# Patient Record
Sex: Female | Born: 1965 | Race: Black or African American | Hispanic: No | Marital: Single | Smoking: Never smoker
Health system: Southern US, Community
[De-identification: ages and names within clinical notes are randomized; demographics above are authoritative.]

## PROBLEM LIST (undated history)

## (undated) ENCOUNTER — Ambulatory Visit: Admission: EM | Payer: Medicare HMO

## (undated) DIAGNOSIS — J42 Unspecified chronic bronchitis: Secondary | ICD-10-CM

## (undated) DIAGNOSIS — D649 Anemia, unspecified: Secondary | ICD-10-CM

## (undated) DIAGNOSIS — F32A Depression, unspecified: Secondary | ICD-10-CM

## (undated) DIAGNOSIS — K219 Gastro-esophageal reflux disease without esophagitis: Secondary | ICD-10-CM

## (undated) DIAGNOSIS — F419 Anxiety disorder, unspecified: Secondary | ICD-10-CM

## (undated) DIAGNOSIS — Z8619 Personal history of other infectious and parasitic diseases: Secondary | ICD-10-CM

## (undated) HISTORY — PX: NO PAST SURGERIES: SHX2092

---

## 2011-01-30 DIAGNOSIS — D509 Iron deficiency anemia, unspecified: Secondary | ICD-10-CM | POA: Insufficient documentation

## 2011-01-30 DIAGNOSIS — K219 Gastro-esophageal reflux disease without esophagitis: Secondary | ICD-10-CM | POA: Insufficient documentation

## 2011-09-29 DIAGNOSIS — M21969 Unspecified acquired deformity of unspecified lower leg: Secondary | ICD-10-CM | POA: Insufficient documentation

## 2011-11-09 DIAGNOSIS — M2010 Hallux valgus (acquired), unspecified foot: Secondary | ICD-10-CM | POA: Insufficient documentation

## 2011-12-25 DIAGNOSIS — J45909 Unspecified asthma, uncomplicated: Secondary | ICD-10-CM | POA: Insufficient documentation

## 2012-03-28 DIAGNOSIS — F32A Depression, unspecified: Secondary | ICD-10-CM | POA: Insufficient documentation

## 2012-04-10 DIAGNOSIS — H9190 Unspecified hearing loss, unspecified ear: Secondary | ICD-10-CM | POA: Insufficient documentation

## 2012-10-04 DIAGNOSIS — D219 Benign neoplasm of connective and other soft tissue, unspecified: Secondary | ICD-10-CM | POA: Insufficient documentation

## 2013-03-04 DIAGNOSIS — H8110 Benign paroxysmal vertigo, unspecified ear: Secondary | ICD-10-CM | POA: Insufficient documentation

## 2013-10-28 DIAGNOSIS — E669 Obesity, unspecified: Secondary | ICD-10-CM | POA: Insufficient documentation

## 2016-04-24 DIAGNOSIS — R7303 Prediabetes: Secondary | ICD-10-CM | POA: Insufficient documentation

## 2016-10-18 DIAGNOSIS — E881 Lipodystrophy, not elsewhere classified: Secondary | ICD-10-CM | POA: Insufficient documentation

## 2020-01-26 ENCOUNTER — Encounter: Payer: Self-pay | Admitting: Emergency Medicine

## 2020-01-26 ENCOUNTER — Other Ambulatory Visit: Payer: Self-pay

## 2020-01-26 ENCOUNTER — Ambulatory Visit
Admission: EM | Admit: 2020-01-26 | Discharge: 2020-01-26 | Disposition: A | Discharge status: Home / Self Care | Payer: Medicare Other | Attending: Emergency Medicine | Admitting: Emergency Medicine

## 2020-01-26 DIAGNOSIS — B349 Viral infection, unspecified: Secondary | ICD-10-CM

## 2020-01-26 DIAGNOSIS — U071 COVID-19: Secondary | ICD-10-CM

## 2020-01-26 DIAGNOSIS — Z20822 Contact with and (suspected) exposure to covid-19: Secondary | ICD-10-CM

## 2020-01-26 HISTORY — DX: Gastro-esophageal reflux disease without esophagitis: K21.9

## 2020-01-26 HISTORY — DX: Anemia, unspecified: D64.9

## 2020-01-26 HISTORY — DX: Anxiety disorder, unspecified: F41.9

## 2020-01-26 HISTORY — DX: Unspecified chronic bronchitis: J42

## 2020-01-26 HISTORY — DX: COVID-19: U07.1

## 2020-01-26 HISTORY — DX: Personal history of other infectious and parasitic diseases: Z86.19

## 2020-01-26 HISTORY — DX: Depression, unspecified: F32.A

## 2020-01-26 NOTE — ED Provider Notes (Signed)
[facility] ____________________________________________  Time seen: Approximately 9:38 AM  I have reviewed the triage vital signs and the nursing notes.   HISTORY  Chief Complaint Fatigue, Generalized Body Aches, Headache, and Covid Exposure   HPI Angela Stevens is a 54 y.o. female presenting for evaluation of 1 day of runny nose, nasal congestion, cough and body aches.  States cough is minor, more nasal congestion.  Did take some over-the-counter ibuprofen which helped.  Denies known fevers.  Slightly diminished taste, no changes in smell.  Has not had COVID-19 vaccines.  Denies chest pain or shortness of breath or sore throat.  Continues eat and drink well.  Denies other aggravating alleviating factors.  Reports on 7 1 she was last around a friend who then subsequently tested positive for COVID-19 this past weekend.  Reports otherwise doing well.  Denies recent sickness.   Past Medical History:  Diagnosis Date  . Anemia   . Anxiety   . Chronic bronchitis ([place])   . Depression   . GERD (gastroesophageal reflux disease)   . History of Helicobacter pylori infection     There are no problems to display for this patient.   Past Surgical History:  Procedure Laterality Date  . NO PAST SURGERIES       No current facility-administered medications for this encounter.  Current Outpatient Medications:  .  albuterol (VENTOLIN HFA) 108 (90 Base) MCG/ACT inhaler, 2 puff(s) by inhalation every 4-6 hours prn, Disp: , Rfl:  .  buPROPion (WELLBUTRIN XL) 300 MG 24 hr tablet, Take 1 tablet by mouth daily., Disp: , Rfl:  .  clonazePAM (KLONOPIN) 0.5 MG tablet, Take 0.5 mg by mouth daily as needed., Disp: , Rfl:  .  famotidine (PEPCID) 40 MG tablet, Take 40 mg by mouth 2 (two) times daily., Disp: , Rfl:  .  fexofenadine (ALLEGRA) 180 MG tablet, Take 1 tablet by mouth daily as needed., Disp: , Rfl:  .  fluticasone (FLONASE) 50 MCG/ACT nasal spray, Place 2 sprays into the nose daily  as needed., Disp: , Rfl:  .  fluticasone (FLOVENT HFA) 110 MCG/ACT inhaler, Inhale 1 puff into the lungs 2 (two) times daily., Disp: , Rfl:  .  meloxicam (MOBIC) 15 MG tablet, Take 1 tablet by mouth daily as needed., Disp: , Rfl:  .  omeprazole (PRILOSEC) 20 MG capsule, Take 1 capsule by mouth daily., Disp: , Rfl:  .  polyethylene glycol powder (GLYCOLAX/MIRALAX) 17 GM/SCOOP powder, daily., Disp: , Rfl:  .  sucralfate (CARAFATE) 1 g tablet, Take 1 tablet by mouth at bedtime., Disp: , Rfl:  .  triamcinolone ointment (KENALOG) 0.1 %, Apply topically 2 (two) times daily., Disp: , Rfl:   Allergies Patient has no known allergies.  Family History  Problem Relation Age of Onset  . Alzheimer's disease Mother   . Diabetes Mother   . Kidney failure Father     Social History Social History   Tobacco Use  . Smoking status: Never Smoker  . Smokeless tobacco: Never Used  Vaping Use  . Vaping Use: Never used  Substance Use Topics  . Alcohol use: Never  . Drug use: Never    Review of Systems Constitutional: No fever ENT: No sore throat.  Positive congestion. Cardiovascular: Denies chest pain. Respiratory: Denies shortness of breath. Gastrointestinal: No abdominal pain.  No nausea, no vomiting.  No diarrhea. Musculoskeletal: Negative for back pain. Skin: Negative for rash.   ____________________________________________   PHYSICAL EXAM:  VITAL SIGNS: ED Triage Vitals  Enc Vitals Group     BP 01/26/20 0908 116/79     Pulse Rate 01/26/20 0908 75     Resp 01/26/20 0908 18     Temp 01/26/20 0908 98.4 F (36.9 C)     Temp Source 01/26/20 0908 Oral     SpO2 01/26/20 0908 99 %     Weight 01/26/20 0909 199 lb (90.3 kg)     Height 01/26/20 0909 5\' 4"  (1.626 m)     Head Circumference --      Peak Flow --      Pain Score 01/26/20 0908 8     Pain Loc --      Pain Edu? --      Excl. in [place]? --     Constitutional: Alert and oriented. Well appearing and in no acute distress. Eyes:  Conjunctivae are normal.  Head: Atraumatic. No sinus tenderness to palpation. No swelling. No erythema.  Ears: no erythema, normal TMs bilaterally.   Nose:Nasal congestion  Mouth/Throat: Mucous membranes are moist. No pharyngeal erythema. No tonsillar swelling or exudate.  Neck: No stridor.  No cervical spine tenderness to palpation. Hematological/Lymphatic/Immunilogical: No cervical lymphadenopathy. Cardiovascular: Normal rate, regular rhythm. Grossly normal heart sounds.  Good peripheral circulation. Respiratory: Normal respiratory effort.  No retractions. No wheezes, rales or rhonchi. Good air movement.  Musculoskeletal: Ambulatory with steady gait.  Neurologic:  Normal speech and language. No gait instability. Skin:  Skin appears warm, dry and intact. No rash noted. Psychiatric: Mood and affect are normal. Speech and behavior are normal.  ___________________________________________   LABS (all labs ordered are listed, but only abnormal results are displayed)  Labs Reviewed  SARS CORONAVIRUS 2 (TAT 6-24 HRS)   ____________________________________________  PROCEDURES Procedures    INITIAL IMPRESSION / ASSESSMENT AND PLAN / ED COURSE  Pertinent labs & imaging results that were available during my care of the patient were reviewed by me and considered in my medical decision making (see chart for details).  Overall well-appearing patient.  No acute distress.  Suspect viral illness, concern for COVID-19.  COVID-19 testing ordered.  Encourage rest, fluids, supportive care.  Work note given while awaiting test result.  Discussed follow up with Primary care physician this week as needed. Discussed follow up and return parameters including no resolution or any worsening concerns. Patient verbalized understanding and agreed to plan.   ____________________________________________   FINAL CLINICAL IMPRESSION(S) / ED DIAGNOSES  Final diagnoses:  Viral illness  Exposure to COVID-19  virus     ED Discharge Orders    None       Note: This dictation was prepared with Dragon dictation along with smaller phrase technology. Any transcriptional errors that result from this process are unintentional.         Marylene Land, NP 01/26/20 0945

## 2020-01-26 NOTE — Discharge Instructions (Signed)
Take over-the-counter cough congestion medication as needed.  Rest. Drink plenty of fluids.   Follow up with your primary care physician this week as needed. Return to Urgent care or ER for new or worsening concerns.

## 2020-01-26 NOTE — ED Triage Notes (Signed)
Patient in today c/o headache, fatigue and body aches x 1 day. Patient  had a +covid exposure on 01/22/20. Patient has not had the covid vaccines. Patient took OTC Ibuprofen this morning.

## 2020-01-27 LAB — SARS CORONAVIRUS 2 (TAT 6-24 HRS): SARS Coronavirus 2: POSITIVE — AB

## 2020-01-28 ENCOUNTER — Telehealth: Payer: Self-pay | Admitting: Physician Assistant

## 2020-01-28 NOTE — Telephone Encounter (Addendum)
Called to discuss with patient about Covid symptoms and the use of bamlanivimab/etesevimab or casirivimab/imdevimab, a monoclonal antibody infusion for those with mild to moderate Covid symptoms and at a high risk of hospitalization.  Pt is qualified for this infusion at the [facility] due to COPD   She lives in [place] and prefers to get it done in [place]. I have reached out to [place] with the [facility].   Angelena Form PA-C  [facility]

## 2020-02-05 DIAGNOSIS — J1282 Pneumonia due to coronavirus disease 2019: Secondary | ICD-10-CM | POA: Insufficient documentation

## 2020-02-16 ENCOUNTER — Encounter: Payer: Self-pay | Admitting: Emergency Medicine

## 2020-02-16 ENCOUNTER — Ambulatory Visit
Admission: EM | Admit: 2020-02-16 | Discharge: 2020-02-16 | Disposition: A | Discharge status: Home / Self Care | Payer: Medicare Other | Attending: Family Medicine | Admitting: Family Medicine

## 2020-02-16 ENCOUNTER — Other Ambulatory Visit: Payer: Self-pay

## 2020-02-16 DIAGNOSIS — H8110 Benign paroxysmal vertigo, unspecified ear: Secondary | ICD-10-CM | POA: Diagnosis not present

## 2020-02-16 MED ORDER — MECLIZINE HCL 25 MG PO TABS
25.0000 mg | ORAL_TABLET | Freq: Three times a day (TID) | ORAL | 0 refills | Status: DC | PRN
Start: 2020-02-16 — End: 2021-11-17

## 2020-02-16 NOTE — ED Provider Notes (Signed)
[facility]    CSN: [id number] Arrival date & time: 02/16/20  1308      History   Chief Complaint Chief Complaint  Patient presents with  . Dizziness    HPI Angela Stevens is a 54 y.o. female.   54 yo female with a c/o dizziness/vertigo since last week. States sensation is as if room was spinning. Symptom is worse with head movement or position changes. Denies any fevers, chills, numbness/tingling, unilateral weakness. States she had covid about 3 weeks ago.    Dizziness   Past Medical History:  Diagnosis Date  . Anemia   . Anxiety   . Chronic bronchitis ([place])   . COVID-19 01/26/2020  . Depression   . GERD (gastroesophageal reflux disease)   . History of Helicobacter pylori infection     There are no problems to display for this patient.   Past Surgical History:  Procedure Laterality Date  . NO PAST SURGERIES      OB History   No obstetric history on file.      Home Medications    Prior to Admission medications   Medication Sig Start Date End Date Taking? Authorizing Provider  albuterol (VENTOLIN HFA) 108 (90 Base) MCG/ACT inhaler 2 puff(s) by inhalation every 4-6 hours prn 10/24/18  Yes [provider]  buPROPion (WELLBUTRIN XL) 300 MG 24 hr tablet Take 1 tablet by mouth daily. 12/09/18  Yes [provider]  clonazePAM (KLONOPIN) 0.5 MG tablet Take 0.5 mg by mouth daily as needed. 01/05/20  Yes [provider]  famotidine (PEPCID) 40 MG tablet Take 40 mg by mouth 2 (two) times daily. 09/25/19  Yes [provider]  fexofenadine (ALLEGRA) 180 MG tablet Take 1 tablet by mouth daily as needed.   Yes [provider]  fluticasone (FLONASE) 50 MCG/ACT nasal spray Place 2 sprays into the nose daily as needed. 07/29/18  Yes [provider]  omeprazole (PRILOSEC) 20 MG capsule Take 1 capsule by mouth daily. 01/05/20  Yes [provider]  polyethylene glycol powder (GLYCOLAX/MIRALAX) 17 GM/SCOOP powder  daily.   Yes [provider]  sucralfate (CARAFATE) 1 g tablet Take 1 tablet by mouth at bedtime. 10/13/19  Yes [provider]  fluticasone (FLOVENT HFA) 110 MCG/ACT inhaler Inhale 1 puff into the lungs 2 (two) times daily. 03/24/15   [provider]  meclizine (ANTIVERT) 25 MG tablet Take 1 tablet (25 mg total) by mouth 3 (three) times daily as needed for dizziness. 02/16/20   Norval Gable, MD  meloxicam (MOBIC) 15 MG tablet Take 1 tablet by mouth daily as needed. 05/26/19   [provider]  triamcinolone ointment (KENALOG) 0.1 % Apply topically 2 (two) times daily. 12/29/19   [provider]    Family History Family History  Problem Relation Age of Onset  . Alzheimer's disease Mother   . Diabetes Mother   . Kidney failure Father     Social History Social History   Tobacco Use  . Smoking status: Never Smoker  . Smokeless tobacco: Never Used  Vaping Use  . Vaping Use: Never used  Substance Use Topics  . Alcohol use: Never  . Drug use: Never     Allergies   Patient has no known allergies.   Review of Systems Review of Systems  Neurological: Positive for dizziness.     Physical Exam Triage Vital Signs ED Triage Vitals  Enc Vitals Group     BP 02/16/20 1342 121/75  Pulse Rate 02/16/20 1342 89     Resp 02/16/20 1342 18     Temp 02/16/20 1342 98.4 F (36.9 C)     Temp Source 02/16/20 1342 Oral     SpO2 02/16/20 1342 99 %     Weight 02/16/20 1340 197 lb (89.4 kg)     Height 02/16/20 1340 5\' 4"  (1.626 m)     Head Circumference --      Peak Flow --      Pain Score 02/16/20 1339 0     Pain Loc --      Pain Edu? --      Excl. in [place]? --    No data found.  Updated Vital Signs BP 121/75 (BP Location: Right Arm)   Pulse 89   Temp 98.4 F (36.9 C) (Oral)   Resp 18   Ht 5\' 4"  (1.626 m)   Wt 89.4 kg   SpO2 99%   BMI 33.81 kg/m   Visual Acuity Right Eye Distance:   Left Eye Distance:   Bilateral Distance:     Right Eye Near:   Left Eye Near:    Bilateral Near:     Physical Exam Vitals and nursing note reviewed.  Constitutional:      General: She is not in acute distress.    Appearance: She is not toxic-appearing or diaphoretic.  HENT:     Right Ear: Tympanic membrane normal.     Left Ear: Tympanic membrane normal.  Eyes:     Extraocular Movements: Extraocular movements intact.     Pupils: Pupils are equal, round, and reactive to light.  Cardiovascular:     Rate and Rhythm: Normal rate and regular rhythm.  Pulmonary:     Effort: Pulmonary effort is normal. No respiratory distress.  Musculoskeletal:     Cervical back: Neck supple.  Neurological:     General: No focal deficit present.     Mental Status: She is alert and oriented to person, place, and time.     Cranial Nerves: No cranial nerve deficit.     Sensory: No sensory deficit.     Motor: No weakness.     Coordination: Coordination normal.     Gait: Gait normal.     Deep Tendon Reflexes: Reflexes normal.     Comments: Positive Hallpike maneuver       UC Treatments / Results  Labs (all labs ordered are listed, but only abnormal results are displayed) Labs Reviewed - No data to display  EKG   Radiology No results found.  Procedures Procedures (including critical care time)  Medications Ordered in UC Medications - No data to display  Initial Impression / Assessment and Plan / UC Course  I have reviewed the triage vital signs and the nursing notes.  Pertinent labs & imaging results that were available during my care of the patient were reviewed by me and considered in my medical decision making (see chart for details).     Final Clinical Impressions(s) / UC Diagnoses   Final diagnoses:  Benign paroxysmal positional vertigo, unspecified laterality    ED Prescriptions    Medication Sig Dispense Auth. Provider   meclizine (ANTIVERT) 25 MG tablet Take 1 tablet (25 mg total) by mouth 3 (three) times daily  as needed for dizziness. 30 tablet Norval Gable, MD      1. diagnosis reviewed with patient 2. rx as per orders above; reviewed possible side effects, interactions, risks and benefits  3. Recommend supportive treatment with  vestibular exercises 4. Follow-up prn if symptoms worsen or don't improve   PDMP not reviewed this encounter.   Norval Gable, MD 02/16/20 1520

## 2020-02-16 NOTE — ED Triage Notes (Signed)
Patient c/o vertigo that started last week when she was out sick with COVID. She tested positive for COVID on 01/26/2020. She states she has been out of quarantine since 02/05/2020. She does report having vertigo in the past.

## 2020-08-12 ENCOUNTER — Ambulatory Visit
Admission: EM | Admit: 2020-08-12 | Discharge: 2020-08-12 | Disposition: A | Discharge status: Home / Self Care | Payer: Medicare Other | Attending: Sports Medicine | Admitting: Sports Medicine

## 2020-08-12 ENCOUNTER — Other Ambulatory Visit: Payer: Self-pay

## 2020-08-12 DIAGNOSIS — J069 Acute upper respiratory infection, unspecified: Secondary | ICD-10-CM | POA: Diagnosis not present

## 2020-08-12 DIAGNOSIS — M791 Myalgia, unspecified site: Secondary | ICD-10-CM | POA: Diagnosis not present

## 2020-08-12 DIAGNOSIS — U071 COVID-19: Secondary | ICD-10-CM | POA: Insufficient documentation

## 2020-08-12 DIAGNOSIS — R519 Headache, unspecified: Secondary | ICD-10-CM

## 2020-08-12 DIAGNOSIS — Z20822 Contact with and (suspected) exposure to covid-19: Secondary | ICD-10-CM

## 2020-08-12 LAB — SARS CORONAVIRUS 2 (TAT 6-24 HRS): SARS Coronavirus 2: POSITIVE — AB

## 2020-08-12 NOTE — ED Provider Notes (Signed)
[facility]    CSN: [id number] Arrival date & time: 08/12/20  0827      History   Chief Complaint Chief Complaint  Patient presents with  . Cough    HPI Angela Stevens is a 55 y.o. female.   Pleasant 55 year old female who presents for evaluation of the above issues.  Patient reports cough, rhinorrhea, headache, myalgia, scratchy throat and sneezing for about 5 days.  She is concerned for COVID.  She has COVID exposure.  She has been vaccinated x2 but no booster.  She has received her flu shot.  No fever shakes chills, no nausea vomiting or diarrhea.  No chest pain or shortness of breath.  No abdominal or urinary symptoms.  She has had COVID in the past July 2021 and spent 3 days in the hospital.  No red flag signs or symptoms elicited on history.  She stays at home and does not work outside the home.     Past Medical History:  Diagnosis Date  . Anemia   . Anxiety   . Chronic bronchitis ([place])   . COVID-19 01/26/2020  . Depression   . GERD (gastroesophageal reflux disease)   . History of Helicobacter pylori infection     There are no problems to display for this patient.   Past Surgical History:  Procedure Laterality Date  . NO PAST SURGERIES      OB History   No obstetric history on file.      Home Medications    Prior to Admission medications   Medication Sig Start Date End Date Taking? Authorizing Provider  albuterol (VENTOLIN HFA) 108 (90 Base) MCG/ACT inhaler 2 puff(s) by inhalation every 4-6 hours prn 10/24/18  Yes [provider]  buPROPion (WELLBUTRIN XL) 300 MG 24 hr tablet Take 1 tablet by mouth daily. 12/09/18  Yes [provider]  clonazePAM (KLONOPIN) 0.5 MG tablet Take 0.5 mg by mouth daily as needed. 01/05/20  Yes [provider]  famotidine (PEPCID) 40 MG tablet Take 40 mg by mouth 2 (two) times daily. 09/25/19  Yes [provider]  fexofenadine (ALLEGRA) 180 MG tablet Take 1 tablet by mouth daily as  needed.   Yes [provider]  fluticasone (FLONASE) 50 MCG/ACT nasal spray Place 2 sprays into the nose daily as needed. 07/29/18  Yes [provider]  fluticasone (FLOVENT HFA) 110 MCG/ACT inhaler Inhale 1 puff into the lungs 2 (two) times daily. 03/24/15  Yes [provider]  meclizine (ANTIVERT) 25 MG tablet Take 1 tablet (25 mg total) by mouth 3 (three) times daily as needed for dizziness. 02/16/20  Yes Norval Gable, MD  meloxicam (MOBIC) 15 MG tablet Take 1 tablet by mouth daily as needed. 05/26/19  Yes [provider]  omeprazole (PRILOSEC) 20 MG capsule Take 1 capsule by mouth daily. 01/05/20  Yes [provider]  polyethylene glycol powder (GLYCOLAX/MIRALAX) 17 GM/SCOOP powder daily.   Yes [provider]  sucralfate (CARAFATE) 1 g tablet Take 1 tablet by mouth at bedtime. 10/13/19  Yes [provider]  triamcinolone ointment (KENALOG) 0.1 % Apply topically 2 (two) times daily. 12/29/19  Yes [provider]    Family History Family History  Problem Relation Age of Onset  . Alzheimer's disease Mother   . Diabetes Mother   . Kidney failure Father     Social History Social History   Tobacco Use  . Smoking status: Never Smoker  . Smokeless tobacco: Never Used  Vaping Use  .  Vaping Use: Never used  Substance Use Topics  . Alcohol use: Never  . Drug use: Never     Allergies   Patient has no known allergies.   Review of Systems Review of Systems  Constitutional: Negative for chills, fatigue and fever.  HENT: Positive for congestion, rhinorrhea, sneezing and sore throat. Negative for ear discharge, ear pain, sinus pressure and sinus pain.   Eyes: Negative for pain.  Respiratory: Positive for cough. Negative for apnea, chest tightness, shortness of breath, wheezing and stridor.   Cardiovascular: Negative for chest pain.  Gastrointestinal: Negative for abdominal pain.  Genitourinary: Negative for dysuria.   Musculoskeletal: Positive for myalgias.  Skin: Negative for color change, pallor, rash and wound.  Neurological: Positive for headaches.  All other systems reviewed and are negative.    Physical Exam Triage Vital Signs ED Triage Vitals  Enc Vitals Group     BP 08/12/20 0923 124/69     Pulse --      Resp 08/12/20 0923 18     Temp 08/12/20 0923 98.2 F (36.8 C)     Temp Source 08/12/20 0923 Oral     SpO2 08/12/20 0923 100 %     Weight 08/12/20 0921 214 lb (97.1 kg)     Height 08/12/20 0921 5\' 3"  (1.6 m)     Head Circumference --      Peak Flow --      Pain Score 08/12/20 0921 7     Pain Loc --      Pain Edu? --      Excl. in [place]? --    No data found.  Updated Vital Signs BP 124/69 (BP Location: Left Arm)   Temp 98.2 F (36.8 C) (Oral)   Resp 18   Ht 5\' 3"  (1.6 m)   Wt 97.1 kg   SpO2 100%   BMI 37.91 kg/m   Visual Acuity Right Eye Distance:   Left Eye Distance:   Bilateral Distance:    Right Eye Near:   Left Eye Near:    Bilateral Near:     Physical Exam Constitutional:      General: She is not in acute distress.    Appearance: Normal appearance. She is not ill-appearing or toxic-appearing.  HENT:     Head: Normocephalic and atraumatic.     Right Ear: Tympanic membrane normal.     Left Ear: Tympanic membrane normal.     Nose: No congestion or rhinorrhea.     Mouth/Throat:     Mouth: Mucous membranes are moist.     Pharynx: Posterior oropharyngeal erythema present. No oropharyngeal exudate.  Eyes:     Extraocular Movements: Extraocular movements intact.     Conjunctiva/sclera: Conjunctivae normal.     Pupils: Pupils are equal, round, and reactive to light.  Cardiovascular:     Rate and Rhythm: Normal rate and regular rhythm.     Pulses: Normal pulses.     Heart sounds: Normal heart sounds. No murmur heard. No friction rub. No gallop.   Pulmonary:     Effort: Pulmonary effort is normal. No respiratory distress.     Breath sounds: Normal breath  sounds. No stridor. No wheezing, rhonchi or rales.  Musculoskeletal:     Cervical back: Normal range of motion and neck supple. No rigidity or tenderness.  Lymphadenopathy:     Cervical: Cervical adenopathy present.  Skin:    General: Skin is warm and dry.     Capillary Refill: Capillary refill takes less  than 2 seconds.  Neurological:     General: No focal deficit present.     Mental Status: She is alert and oriented to person, place, and time.      UC Treatments / Results  Labs (all labs ordered are listed, but only abnormal results are displayed) Labs Reviewed  SARS CORONAVIRUS 2 (TAT 6-24 HRS)    EKG   Radiology No results found.  Procedures Procedures (including critical care time)  Medications Ordered in UC Medications - No data to display  Initial Impression / Assessment and Plan / UC Course  I have reviewed the triage vital signs and the nursing notes.  Pertinent labs & imaging results that were available during my care of the patient were reviewed by me and considered in my medical decision making (see chart for details).   Clinical impression: 5 days of flulike symptoms with COVID exposure.  She has been vaccinated and has had COVID back in July 2021.  Treatment plan: 1.  The findings and treatment plan were discussed in detail with the patient.  Patient was in agreement. 2.  Will get a COVID test.  Send it to the hospital for PCR.  I will take 6 to 24 hours.  She is not working outside the home so I have asked her just to isolate pending the results.  If she is positive someone will contact her and she will go into quarantine per CDC guidelines.  She does not need a work note. 3.  Educational handout was provided. 4.  Plenty of rest, plenty of fluids, Tylenol or Motrin for fever discomfort.  Over-the-counter meds as needed. 5.  Red flag signs and symptoms were discussed in detail and when to seek out immediate medical attention. 6.  Follow-up here as  needed.    Final Clinical Impressions(s) / UC Diagnoses   Final diagnoses:  Viral URI with cough  Myalgia  Acute nonintractable headache, unspecified headache type  Close exposure to COVID-19 virus     Discharge Instructions     Will get a COVID test.  Send it to the hospital for PCR.  I will take 6 to 24 hours.  She is not working outside the home so I have asked her just to isolate pending the results.  If she is positive someone will contact her and she will go into quarantine per CDC guidelines.  She does not need a work note. Educational handout was provided. Plenty of rest, plenty of fluids, Tylenol or Motrin for fever discomfort.  Over-the-counter meds as needed. Red flag signs and symptoms were discussed in detail and when to seek out immediate medical attention. Follow-up here as needed.    ED Prescriptions    None     PDMP not reviewed this encounter.   Verda Cumins, MD 08/12/20 1021

## 2020-08-12 NOTE — ED Triage Notes (Signed)
Patient states that she has been having a cough, headache, body aches, sinus pain and pressure x 5 days. States that she is concerned for covid.

## 2020-08-12 NOTE — Discharge Instructions (Addendum)
Will get a COVID test.  Send it to the hospital for PCR.  I will take 6 to 24 hours.  She is not working outside the home so I have asked her just to isolate pending the results.  If she is positive someone will contact her and she will go into quarantine per CDC guidelines.  She does not need a work note. Educational handout was provided. Plenty of rest, plenty of fluids, Tylenol or Motrin for fever discomfort.  Over-the-counter meds as needed. Red flag signs and symptoms were discussed in detail and when to seek out immediate medical attention. Follow-up here as needed.

## 2020-09-30 DIAGNOSIS — R0989 Other specified symptoms and signs involving the circulatory and respiratory systems: Secondary | ICD-10-CM | POA: Insufficient documentation

## 2021-01-06 ENCOUNTER — Other Ambulatory Visit: Payer: Self-pay

## 2021-01-06 ENCOUNTER — Ambulatory Visit
Admission: EM | Admit: 2021-01-06 | Discharge: 2021-01-06 | Disposition: A | Discharge status: Home / Self Care | Payer: Medicare Other

## 2021-01-06 DIAGNOSIS — H5712 Ocular pain, left eye: Secondary | ICD-10-CM

## 2021-01-06 DIAGNOSIS — H04129 Dry eye syndrome of unspecified lacrimal gland: Secondary | ICD-10-CM | POA: Diagnosis not present

## 2021-01-06 DIAGNOSIS — H538 Other visual disturbances: Secondary | ICD-10-CM | POA: Diagnosis not present

## 2021-01-06 NOTE — Discharge Instructions (Addendum)
A limited eye exam in urgent care is normal and it was also normal in ER, but I believe you need to see an eye specialist.  I have contacted [facility] and they are supposed to give you a call today to try to work you in today or tomorrow.  As we discussed, this could be related to an ocular migraine if your eye examines it being normal.  In which case, he should follow-up with your PCP.  [facility] Address: [address], [place], [place] [address] Hours: 8-5 Phone: [phone]

## 2021-01-06 NOTE — ED Triage Notes (Signed)
Pt sts her eyes feels like there is something in them. Sts there is sometimes a shooting pain. Seen at [facility] ED 2 days ago for the same. Sts she have been using eye drops without relief.

## 2021-01-06 NOTE — ED Provider Notes (Signed)
[facility]    CSN: [id number] Arrival date & time: 01/06/21  0805      History   Chief Complaint Chief Complaint  Patient presents with   Eye Problem    HPI Angela Stevens is a 55 y.o. female presenting for approximately 1 week history of left-sided eye pain that she rates about 3 out of 10.  She says it also affects the left frontal region causing her discomfort.  She says that she has had occasionally blurry vision.  She is that her eye feels very dry.  She has not had any drainage from the eye or redness.  She denies any sort of injury to the eye.  Patient admits to occasional feeling dizzy.  She says she has some photophobia present.  No nausea or vomiting.  Patient denies history of migraines or headaches.  She denies any history of eye problems but review of medical record suggest that she has had cataracts.  Patient went to the ED 2 days ago for this complaint and was advised she had a normal eye exam and to try over-the-counter Visine and follow-up with eye specialist.  Patient states she has tried Visine without relief.  She made appointment with an eye specialist but cannot be seen for 2 to 3 weeks and would like some relief sooner.  No other complaints.  HPI  Past Medical History:  Diagnosis Date   Anemia    Anxiety    Chronic bronchitis ([place])    COVID-19 01/26/2020   Depression    GERD (gastroesophageal reflux disease)    History of Helicobacter pylori infection     There are no problems to display for this patient.   Past Surgical History:  Procedure Laterality Date   NO PAST SURGERIES      OB History   No obstetric history on file.      Home Medications    Prior to Admission medications   Medication Sig Start Date End Date Taking? Authorizing Provider  albuterol (VENTOLIN HFA) 108 (90 Base) MCG/ACT inhaler 2 puff(s) by inhalation every 4-6 hours prn 10/24/18   [provider]  buPROPion (WELLBUTRIN XL) 300 MG 24 hr tablet Take 1  tablet by mouth daily. 12/09/18   [provider]  clonazePAM (KLONOPIN) 0.5 MG tablet Take 0.5 mg by mouth daily as needed. 01/05/20   [provider]  famotidine (PEPCID) 40 MG tablet Take 40 mg by mouth 2 (two) times daily. 09/25/19   [provider]  fexofenadine (ALLEGRA) 180 MG tablet Take 1 tablet by mouth daily as needed.    [provider]  fluticasone (FLONASE) 50 MCG/ACT nasal spray Place 2 sprays into the nose daily as needed. 07/29/18   [provider]  fluticasone (FLOVENT HFA) 110 MCG/ACT inhaler Inhale 1 puff into the lungs 2 (two) times daily. 03/24/15   [provider]  meclizine (ANTIVERT) 25 MG tablet Take 1 tablet (25 mg total) by mouth 3 (three) times daily as needed for dizziness. 02/16/20   Norval Gable, MD  meloxicam (MOBIC) 15 MG tablet Take 1 tablet by mouth daily as needed. 05/26/19   [provider]  omeprazole (PRILOSEC) 20 MG capsule Take 1 capsule by mouth daily. 01/05/20   [provider]  polyethylene glycol powder (GLYCOLAX/MIRALAX) 17 GM/SCOOP powder daily.    [provider]  sucralfate (CARAFATE) 1 g tablet Take 1 tablet by mouth at bedtime. 10/13/19   [provider]  triamcinolone ointment (KENALOG) 0.1 %  Apply topically 2 (two) times daily. 12/29/19   [provider]    Family History Family History  Problem Relation Age of Onset   Alzheimer's disease Mother    Diabetes Mother    Kidney failure Father     Social History Social History   Tobacco Use   Smoking status: Never   Smokeless tobacco: Never  Vaping Use   Vaping Use: Never used  Substance Use Topics   Alcohol use: Never   Drug use: Never     Allergies   Patient has no known allergies.   Review of Systems Review of Systems  Constitutional:  Negative for fatigue and fever.  HENT:  Negative for congestion and rhinorrhea.   Eyes:  Positive for photophobia, pain and visual disturbance. Negative  for discharge, redness and itching.  Respiratory:  Negative for cough.   Gastrointestinal:  Negative for nausea and vomiting.  Neurological:  Positive for headaches. Negative for dizziness, syncope, weakness and numbness.    Physical Exam Triage Vital Signs ED Triage Vitals [01/06/21 0819]  Enc Vitals Group     BP 116/69     Pulse Rate 69     Resp 18     Temp 98.1 F (36.7 C)     Temp Source Oral     SpO2 100 %     Weight 212 lb (96.2 kg)     Height 5\' 3"  (1.6 m)     Head Circumference      Peak Flow      Pain Score 8     Pain Loc      Pain Edu?      Excl. in [place]?    No data found.  Updated Vital Signs BP 116/69   Pulse 69   Temp 98.1 F (36.7 C) (Oral)   Resp 18   Ht 5\' 3"  (1.6 m)   Wt 212 lb (96.2 kg)   SpO2 100%   BMI 37.55 kg/m   Visual Acuity Right Eye Distance: 20/25 Left Eye Distance: 20/25 Bilateral Distance: 20/25  Right Eye Near:   Left Eye Near:    Bilateral Near:     Physical Exam Vitals and nursing note reviewed.  Constitutional:      General: She is not in acute distress.    Appearance: Normal appearance. She is not ill-appearing or toxic-appearing.  HENT:     Head: Normocephalic and atraumatic.     Nose: Nose normal.     Mouth/Throat:     Mouth: Mucous membranes are moist.     Pharynx: Oropharynx is clear.  Eyes:     General: No scleral icterus.       Right eye: No discharge.        Left eye: No discharge.     Conjunctiva/sclera: Conjunctivae normal.     Pupils: Pupils are equal, round, and reactive to light.  Cardiovascular:     Rate and Rhythm: Normal rate and regular rhythm.     Heart sounds: Normal heart sounds.  Pulmonary:     Effort: Pulmonary effort is normal. No respiratory distress.     Breath sounds: Normal breath sounds.  Musculoskeletal:     Cervical back: Neck supple.  Skin:    General: Skin is dry.  Neurological:     General: No focal deficit present.     Mental Status: She is alert and oriented to person,  place, and time. Mental status is at baseline.     Cranial Nerves: No cranial  nerve deficit.     Motor: No weakness.     Gait: Gait normal.  Psychiatric:        Mood and Affect: Mood normal.        Behavior: Behavior normal.        Thought Content: Thought content normal.     UC Treatments / Results  Labs (all labs ordered are listed, but only abnormal results are displayed) Labs Reviewed - No data to display  EKG   Radiology No results found.  Procedures Procedures (including critical care time)  Medications Ordered in UC Medications - No data to display  Initial Impression / Assessment and Plan / UC Course  I have reviewed the triage vital signs and the nursing notes.  Pertinent labs & imaging results that were available during my care of the patient were reviewed by me and considered in my medical decision making (see chart for details).  89 old female presenting for left eye pain, dryness, photophobia and occasionally blurred vision.  Also most left-sided headaches and occasional dizziness.  Limited eye exam today is within normal limits.  Vision is grossly intact and 20/25 left, right, and bilateral.  Cranial nerve exam is normal.  Advised patient her limited eye exam is normal but she should see an eye specialist who can perform a more comprehensive eye exam.  I have contacted [facility] in [facility] and they will give patient a call to try to get her in today or tomorrow for an appointment.  Advised her to go to the ED sooner for any acute worsening of her symptoms.  Advised Tylenol and Motrin at this time for the discomfort and trying to rest.  Suggested the patient that if her eye exam ends up being normal then it is possible she could be having an ocular migraine, in which case I would advise her following up with her PCP.  Again, ED precautions reviewed.   Final Clinical Impressions(s) / UC Diagnoses   Final diagnoses:  Left eye pain  Blurry  vision, left eye  Dry eye     Discharge Instructions      A limited eye exam in urgent care is normal and it was also normal in ER, but I believe you need to see an eye specialist.  I have contacted [facility] and they are supposed to give you a call today to try to work you in today or tomorrow.  As we discussed, this could be related to an ocular migraine if your eye examines it being normal.  In which case, he should follow-up with your PCP.  [facility] Address: [address], [place], [place] [address] Hours: 8-5 Phone: [phone]   ED Prescriptions   None    PDMP not reviewed this encounter.   Danton Clap, PA-C 01/06/21 [phone]

## 2021-04-02 ENCOUNTER — Other Ambulatory Visit: Payer: Self-pay

## 2021-04-02 ENCOUNTER — Encounter: Payer: Self-pay | Admitting: Emergency Medicine

## 2021-04-02 ENCOUNTER — Ambulatory Visit
Admission: EM | Admit: 2021-04-02 | Discharge: 2021-04-02 | Disposition: A | Discharge status: Home / Self Care | Payer: Medicare HMO | Attending: Family Medicine | Admitting: Family Medicine

## 2021-04-02 DIAGNOSIS — R300 Dysuria: Secondary | ICD-10-CM | POA: Insufficient documentation

## 2021-04-02 LAB — POCT URINALYSIS DIP (DEVICE)
Bilirubin Urine: NEGATIVE
Glucose, UA: NEGATIVE mg/dL
Ketones, ur: NEGATIVE mg/dL
Leukocytes,Ua: NEGATIVE
Nitrite: NEGATIVE
Protein, ur: NEGATIVE mg/dL
Specific Gravity, Urine: <=1.005 (ref 1.005–1.030)
Urobilinogen, UA: 0.2 mg/dL (ref 0.0–1.0)
pH: 5.5 (ref 5.0–8.0)

## 2021-04-02 MED ORDER — PHENAZOPYRIDINE HCL 200 MG PO TABS: 200.0000 mg | ORAL_TABLET | Freq: Three times a day (TID) | ORAL | 0 refills | Status: DC

## 2021-04-02 NOTE — ED Triage Notes (Signed)
Patient c/o burning when urinating and urinary urgency and frequency for the past 3-4 days.  Patient denies blood in her urine.

## 2021-04-02 NOTE — ED Provider Notes (Signed)
[facility]    CSN: [id number] Arrival date & time: 04/02/21  0818      History   Chief Complaint Chief Complaint  Patient presents with   Dysuria    HPI Leonila Emmi is a 55 y.o. female.   HPI  55 year old female here for evaluation of urinary complaints.  Patient reports that for the last 3 to 4 days she has been experiencing painful urination with urinary urgency and frequency.  She is also had some associated symptoms of low back pain, nocturia with 3-4 trips to the bathroom nightly for low-volume voids.  She has not seen any blood in her urine she denies fever.  She has had some suprapubic tenderness but no other abdominal pain, nausea, or vomiting.  She denies vaginal itching, irritation, or discharge.  Past Medical History:  Diagnosis Date   Anemia    Anxiety    Chronic bronchitis ([place])    COVID-19 01/26/2020   Depression    GERD (gastroesophageal reflux disease)    History of Helicobacter pylori infection     There are no problems to display for this patient.   Past Surgical History:  Procedure Laterality Date   NO PAST SURGERIES      OB History   No obstetric history on file.      Home Medications    Prior to Admission medications   Medication Sig Start Date End Date Taking? Authorizing Provider  albuterol (VENTOLIN HFA) 108 (90 Base) MCG/ACT inhaler 2 puff(s) by inhalation every 4-6 hours prn 10/24/18  Yes [provider]  buPROPion (WELLBUTRIN XL) 300 MG 24 hr tablet Take 1 tablet by mouth daily. 12/09/18  Yes [provider]  clonazePAM (KLONOPIN) 0.5 MG tablet Take 0.5 mg by mouth daily as needed. 01/05/20  Yes [provider]  famotidine (PEPCID) 40 MG tablet Take 40 mg by mouth 2 (two) times daily. 09/25/19  Yes [provider]  fexofenadine (ALLEGRA) 180 MG tablet Take 1 tablet by mouth daily as needed.   Yes [provider]  fluticasone (FLONASE) 50 MCG/ACT nasal spray Place 2 sprays into  the nose daily as needed. 07/29/18  Yes [provider]  fluticasone (FLOVENT HFA) 110 MCG/ACT inhaler Inhale 1 puff into the lungs 2 (two) times daily. 03/24/15  Yes [provider]  meloxicam (MOBIC) 15 MG tablet Take 1 tablet by mouth daily as needed. 05/26/19  Yes [provider]  omeprazole (PRILOSEC) 20 MG capsule Take 1 capsule by mouth daily. 01/05/20  Yes [provider]  phenazopyridine (PYRIDIUM) 200 MG tablet Take 1 tablet (200 mg total) by mouth 3 (three) times daily. 04/02/21  Yes Margarette Canada, NP  sucralfate (CARAFATE) 1 g tablet Take 1 tablet by mouth at bedtime. 10/13/19  Yes [provider]  meclizine (ANTIVERT) 25 MG tablet Take 1 tablet (25 mg total) by mouth 3 (three) times daily as needed for dizziness. 02/16/20   Norval Gable, MD  polyethylene glycol powder (GLYCOLAX/MIRALAX) 17 GM/SCOOP powder daily.    [provider]  triamcinolone ointment (KENALOG) 0.1 % Apply topically 2 (two) times daily. 12/29/19   [provider]    Family History Family History  Problem Relation Age of Onset   Alzheimer's disease Mother    Diabetes Mother    Kidney failure Father     Social History Social History   Tobacco Use   Smoking status: Never   Smokeless tobacco: Never  Vaping Use   Vaping Use: Never used  Substance Use Topics   Alcohol use: Never   Drug use: Never     Allergies   Patient has no known allergies.   Review of Systems Review of Systems  Constitutional:  Negative for activity change, appetite change and fever.  Gastrointestinal:  Positive for abdominal pain. Negative for diarrhea, nausea and vomiting.  Genitourinary:  Positive for dysuria, frequency and urgency. Negative for hematuria, vaginal bleeding, vaginal discharge and vaginal pain.  Musculoskeletal:  Positive for back pain.  Skin:  Negative for rash.  Hematological: Negative.   Psychiatric/Behavioral: Negative.      Physical Exam Triage  Vital Signs ED Triage Vitals  Enc Vitals Group     BP 04/02/21 0827 122/76     Pulse Rate 04/02/21 0827 (!) 55     Resp 04/02/21 0827 14     Temp 04/02/21 0827 98 F (36.7 C)     Temp Source 04/02/21 0827 Oral     SpO2 04/02/21 0827 100 %     Weight 04/02/21 0824 213 lb (96.6 kg)     Height 04/02/21 0824 '5\' 3"'$  (1.6 m)     Head Circumference --      Peak Flow --      Pain Score 04/02/21 0824 8     Pain Loc --      Pain Edu? --      Excl. in [place]? --    No data found.  Updated Vital Signs BP 122/76 (BP Location: Right Arm)   Pulse (!) 55   Temp 98 F (36.7 C) (Oral)   Resp 14   Ht '5\' 3"'$  (1.6 m)   Wt 213 lb (96.6 kg)   SpO2 100%   BMI 37.73 kg/m   Visual Acuity Right Eye Distance:   Left Eye Distance:   Bilateral Distance:    Right Eye Near:   Left Eye Near:    Bilateral Near:     Physical Exam Vitals and nursing note reviewed.  Constitutional:      General: She is not in acute distress.    Appearance: Normal appearance. She is not ill-appearing.  HENT:     Head: Normocephalic and atraumatic.  Cardiovascular:     Rate and Rhythm: Normal rate and regular rhythm.     Pulses: Normal pulses.     Heart sounds: Normal heart sounds. No murmur heard.   No gallop.  Pulmonary:     Effort: Pulmonary effort is normal.     Breath sounds: Normal breath sounds. No wheezing, rhonchi or rales.  Abdominal:     Palpations: Abdomen is soft.     Tenderness: There is abdominal tenderness. There is no right CVA tenderness, left CVA tenderness, guarding or rebound.  Skin:    General: Skin is warm and dry.     Capillary Refill: Capillary refill takes less than 2 seconds.     Findings: No erythema or rash.  Neurological:     General: No focal deficit present.     Mental Status: She is alert and oriented to person, place, and time.  Psychiatric:        Mood and Affect: Mood normal.        Behavior: Behavior normal.        Thought Content: Thought content normal.         Judgment: Judgment normal.     UC Treatments / Results  Labs (all labs ordered are listed, but only abnormal results are displayed) Labs Reviewed  POCT URINALYSIS DIP (  DEVICE) - Abnormal; Notable for the following components:      Result Value   Hgb urine dipstick TRACE (*)    All other components within normal limits  POCT URINALYSIS DIPSTICK, ED / UC  CERVICOVAGINAL ANCILLARY ONLY    EKG   Radiology No results found.  Procedures Procedures (including critical care time)  Medications Ordered in UC Medications - No data to display  Initial Impression / Assessment and Plan / UC Course  I have reviewed the triage vital signs and the nursing notes.  Pertinent labs & imaging results that were available during my care of the patient were reviewed by me and considered in my medical decision making (see chart for details).  Patient is very pleasant, nontoxic-appearing 55 year old female here for evaluation of urinary complaints that have been present for last 3 to 4 days and consist of pain with urination, urinary urgency and frequency, nocturia, and low back pain.  She reports that she is voiding small volumes each time.  She has not seen blood in her urine or had any fever.  She denies any vaginal complaints.  She is having some suprapubic tenderness however.  Patient's physical exam reveals benign cardiopulmonary exam with clear lung sounds in all fields.  No CVA tenderness on exam.  Abdomen is soft with mild suprapubic tenderness.  No guarding or rebound.  Urinalysis collected at triage.  UA shows trace hemoglobin but is otherwise negative.  Patient was evaluated at [facility] urgent care on 12/22/2020 for similar symptoms at that time she had a similar point-of-care urinalysis.  Urine culture showed mixed urogenital flora and it was recommended that she have a recollection.  She was treated with Macrobid at that time twice daily for 5 days.  Will obtain wet prep to look for the presence  of BV or yeast which may be a cause of the patient's urinary symptoms.  I am going to hold on empiric treatment at this time pending the results of the vaginal swab.  With the lack of bacteria or abnormal findings in the urine dip I will refrain from ordering a urine culture at this time.  Advised patient that if her wet prep is negative that her symptoms may be a result of vaginal atrophy as she is postmenopausal.  I suggested that if her vaginal swab is negative she should follow-up with her primary care provider for a pelvic exam.   Final Clinical Impressions(s) / UC Diagnoses   Final diagnoses:  Dysuria     Discharge Instructions      Take the Pyridium every 8 hours as needed for painful urination, urinary urgency, and/or urinary frequency.  This medication will numb your bladder so that she did not have the discomfort and will also trigger urine of bright red-orange.  We will send off the vaginal swab to look for the presence of bacterial vaginosis or yeast infection.  If either those are positive we will treat you at that time.  If your vaginal swab is negative for any abnormal findings your symptoms may be a result of vaginal atrophy due to the fact that you are postmenopausal and I recommend you follow-up with your primary care doctor for a pelvic exam to rule that out as a source of your urinary symptoms.  Return for reevaluation for any new or worsening symptoms.     ED Prescriptions     Medication Sig Dispense Auth. Provider   phenazopyridine (PYRIDIUM) 200 MG tablet Take 1 tablet (200 mg total)  by mouth 3 (three) times daily. 6 tablet Margarette Canada, NP      PDMP not reviewed this encounter.   Margarette Canada, NP 04/02/21 [phone]

## 2021-04-02 NOTE — Discharge Instructions (Addendum)
Take the Pyridium every 8 hours as needed for painful urination, urinary urgency, and/or urinary frequency.  This medication will numb your bladder so that she did not have the discomfort and will also trigger urine of bright red-orange.  We will send off the vaginal swab to look for the presence of bacterial vaginosis or yeast infection.  If either those are positive we will treat you at that time.  If your vaginal swab is negative for any abnormal findings your symptoms may be a result of vaginal atrophy due to the fact that you are postmenopausal and I recommend you follow-up with your primary care doctor for a pelvic exam to rule that out as a source of your urinary symptoms.  Return for reevaluation for any new or worsening symptoms.

## 2021-04-04 LAB — CERVICOVAGINAL ANCILLARY ONLY
Bacterial Vaginitis (gardnerella): POSITIVE — AB
Candida Glabrata: NEGATIVE
Candida Vaginitis: NEGATIVE
Chlamydia: NEGATIVE
Comment: NEGATIVE
Comment: NEGATIVE
Comment: NEGATIVE
Comment: NEGATIVE
Comment: NEGATIVE
Comment: NORMAL
Neisseria Gonorrhea: NEGATIVE
Trichomonas: NEGATIVE

## 2021-04-06 ENCOUNTER — Telehealth (HOSPITAL_COMMUNITY): Payer: Self-pay | Admitting: Emergency Medicine

## 2021-04-06 MED ORDER — METRONIDAZOLE 500 MG PO TABS: 500.0000 mg | ORAL_TABLET | Freq: Two times a day (BID) | ORAL | 0 refills | Status: DC

## 2021-06-21 ENCOUNTER — Other Ambulatory Visit: Payer: Self-pay

## 2021-06-21 ENCOUNTER — Ambulatory Visit
Admission: EM | Admit: 2021-06-21 | Discharge: 2021-06-21 | Disposition: A | Discharge status: Home / Self Care | Payer: Medicare HMO | Attending: Emergency Medicine | Admitting: Emergency Medicine

## 2021-06-21 DIAGNOSIS — J039 Acute tonsillitis, unspecified: Secondary | ICD-10-CM

## 2021-06-21 MED ORDER — AMOXICILLIN-POT CLAVULANATE 875-125 MG PO TABS: 1.0000 | ORAL_TABLET | Freq: Two times a day (BID) | ORAL | 0 refills | Status: DC

## 2021-06-21 NOTE — Discharge Instructions (Signed)
Take the Augmentin twice daily for 7 days for treatment of your tonsillitis.  Gargle with warm salt water 2-3 times a day to soothe your throat, aid in pain relief, and aid in healing.  Take over-the-counter ibuprofen according to the package instructions as needed for pain.  You can also use Chloraseptic or Sucrets lozenges, 1 lozenge every 2 hours as needed for throat pain.  If you develop any new or worsening symptoms return for reevaluation.

## 2021-06-21 NOTE — ED Provider Notes (Signed)
[facility]    CSN: [id number] Arrival date & time: 06/21/21  7353      History   Chief Complaint Chief Complaint  Patient presents with   Sore Throat    HPI Angela Stevens is a 55 y.o. female.   HPI  55 year old female here for evaluation of sore throat, fatigue, and body aches.  Patient ports that she had the above symptoms for the last 4 days.  She is also been experiencing pain in her right ear and chills.  She states she is unsure whether she has had a fever or not.  She denies runny nose or nasal congestion, cough, or GI complaints.  She states that her granddaughter was diagnosed with influenza a couple weeks ago and she did keep her for a few days.  Past Medical History:  Diagnosis Date   Anemia    Anxiety    Chronic bronchitis ([place])    COVID-19 01/26/2020   Depression    GERD (gastroesophageal reflux disease)    History of Helicobacter pylori infection     There are no problems to display for this patient.   Past Surgical History:  Procedure Laterality Date   NO PAST SURGERIES      OB History   No obstetric history on file.      Home Medications    Prior to Admission medications   Medication Sig Start Date End Date Taking? Authorizing Provider  albuterol (VENTOLIN HFA) 108 (90 Base) MCG/ACT inhaler 2 puff(s) by inhalation every 4-6 hours prn 10/24/18  Yes [provider]  amoxicillin-clavulanate (AUGMENTIN) 875-125 MG tablet Take 1 tablet by mouth every 12 (twelve) hours. 06/21/21  Yes Margarette Canada, NP  buPROPion (WELLBUTRIN XL) 300 MG 24 hr tablet Take 1 tablet by mouth daily. 12/09/18  Yes [provider]  clonazePAM (KLONOPIN) 0.5 MG tablet Take 0.5 mg by mouth daily as needed. 01/05/20  Yes [provider]  famotidine (PEPCID) 40 MG tablet Take 40 mg by mouth 2 (two) times daily. 09/25/19  Yes [provider]  fexofenadine (ALLEGRA) 180 MG tablet Take 1 tablet by mouth daily as needed.   Yes [provider]  fluticasone (FLONASE) 50 MCG/ACT nasal spray Place 2 sprays into the nose daily as needed. 07/29/18  Yes [provider]  fluticasone (FLOVENT HFA) 110 MCG/ACT inhaler Inhale 1 puff into the lungs 2 (two) times daily. 03/24/15  Yes [provider]  meclizine (ANTIVERT) 25 MG tablet Take 1 tablet (25 mg total) by mouth 3 (three) times daily as needed for dizziness. 02/16/20  Yes Norval Gable, MD  meloxicam (MOBIC) 15 MG tablet Take 1 tablet by mouth daily as needed. 05/26/19  Yes [provider]  omeprazole (PRILOSEC) 20 MG capsule Take 1 capsule by mouth daily. 01/05/20  Yes [provider]  phenazopyridine (PYRIDIUM) 200 MG tablet Take 1 tablet (200 mg total) by mouth 3 (three) times daily. 04/02/21  Yes Margarette Canada, NP  polyethylene glycol powder (GLYCOLAX/MIRALAX) 17 GM/SCOOP powder daily.   Yes [provider]  sucralfate (CARAFATE) 1 g tablet Take 1 tablet by mouth at bedtime. 10/13/19  Yes [provider]  triamcinolone ointment (KENALOG) 0.1 % Apply topically 2 (two) times daily. 12/29/19  Yes [provider]    Family History Family History  Problem Relation Age of Onset   Alzheimer's disease Mother    Diabetes Mother    Kidney failure Father     Social History Social History  Tobacco Use   Smoking status: Never   Smokeless tobacco: Never  Vaping Use   Vaping Use: Never used  Substance Use Topics   Alcohol use: Never   Drug use: Never     Allergies   Patient has no known allergies.   Review of Systems Review of Systems  Constitutional:  Positive for chills and fatigue. Negative for fever.  HENT:  Positive for ear pain and sore throat. Negative for congestion and rhinorrhea.   Respiratory:  Negative for cough, shortness of breath and wheezing.   Gastrointestinal:  Negative for diarrhea, nausea and vomiting.  Musculoskeletal:  Positive for arthralgias and myalgias.  Skin:  Negative for rash.   Hematological: Negative.   Psychiatric/Behavioral: Negative.      Physical Exam Triage Vital Signs ED Triage Vitals  Enc Vitals Group     BP 06/21/21 1108 104/73     Pulse Rate 06/21/21 1108 76     Resp 06/21/21 1108 18     Temp 06/21/21 1108 98.5 F (36.9 C)     Temp Source 06/21/21 1108 Oral     SpO2 06/21/21 1108 100 %     Weight 06/21/21 1106 199 lb (90.3 kg)     Height 06/21/21 1106 5\' 3"  (1.6 m)     Head Circumference --      Peak Flow --      Pain Score 06/21/21 1105 8     Pain Loc --      Pain Edu? --      Excl. in [place]? --    No data found.  Updated Vital Signs BP 104/73 (BP Location: Left Arm)   Pulse 76   Temp 98.5 F (36.9 C) (Oral)   Resp 18   Ht 5\' 3"  (1.6 m)   Wt 199 lb (90.3 kg)   SpO2 100%   BMI 35.25 kg/m   Visual Acuity Right Eye Distance:   Left Eye Distance:   Bilateral Distance:    Right Eye Near:   Left Eye Near:    Bilateral Near:     Physical Exam Vitals and nursing note reviewed.  Constitutional:      General: She is not in acute distress.    Appearance: Normal appearance. She is normal weight. She is ill-appearing.  HENT:     Head: Normocephalic and atraumatic.     Right Ear: Tympanic membrane, ear canal and external ear normal. There is no impacted cerumen.     Left Ear: Tympanic membrane, ear canal and external ear normal. There is no impacted cerumen.     Nose: Nose normal. No congestion or rhinorrhea.     Mouth/Throat:     Mouth: Mucous membranes are moist.     Pharynx: Oropharyngeal exudate and posterior oropharyngeal erythema present.  Cardiovascular:     Rate and Rhythm: Normal rate and regular rhythm.     Pulses: Normal pulses.     Heart sounds: Normal heart sounds. No murmur heard.   No gallop.  Pulmonary:     Effort: Pulmonary effort is normal.     Breath sounds: Normal breath sounds. No wheezing, rhonchi or rales.  Musculoskeletal:     Cervical back: Normal range of motion and neck supple.  Lymphadenopathy:      Cervical: No cervical adenopathy.  Skin:    General: Skin is warm and dry.     Capillary Refill: Capillary refill takes less than 2 seconds.     Findings: No erythema or rash.  Neurological:  General: No focal deficit present.     Mental Status: She is alert and oriented to person, place, and time.  Psychiatric:        Mood and Affect: Mood normal.        Behavior: Behavior normal.        Thought Content: Thought content normal.        Judgment: Judgment normal.     UC Treatments / Results  Labs (all labs ordered are listed, but only abnormal results are displayed) Labs Reviewed - No data to display  EKG   Radiology No results found.  Procedures Procedures (including critical care time)  Medications Ordered in UC Medications - No data to display  Initial Impression / Assessment and Plan / UC Course  I have reviewed the triage vital signs and the nursing notes.  Pertinent labs & imaging results that were available during my care of the patient were reviewed by me and considered in my medical decision making (see chart for details).  Patient is a pleasant, though ill-appearing, 55 year old female here for evaluation of sore throat, body aches, and fatigue that been present for the last 4 days and are associated with pain in the right ear and chills.  Patient states she is unsure if she has had a fever as she has not taken it.  She was exposed to influenza within the past 2 weeks but she denies any associated upper respiratory symptoms such as runny nose or nasal congestion.  On physical exam patient has pearly gray tympanic membranes bilaterally with normal light reflex and clear external auditory canals.  Nasal mucosa is pink and moist without erythema, edema, or discharge.  Oropharyngeal exam reveals bilateral erythematous and edematous tonsillar pillars with white-green exudate.  No cervical lymphadenopathy appreciated on exam.  Cardiopulmonary exam reveals clung sounds in  all fields.  Patient exam is consistent with tonsillitis and will treat as such with Augmentin twice daily for 7 days, salt water gargles, Chloraseptic and Sucrets lozenges, and over-the-counter Tylenol and ibuprofen as needed for fever and body aches.  Patient denies a need for work note.   Final Clinical Impressions(s) / UC Diagnoses   Final diagnoses:  Tonsillitis     Discharge Instructions      Take the Augmentin twice daily for 7 days for treatment of your tonsillitis.  Gargle with warm salt water 2-3 times a day to soothe your throat, aid in pain relief, and aid in healing.  Take over-the-counter ibuprofen according to the package instructions as needed for pain.  You can also use Chloraseptic or Sucrets lozenges, 1 lozenge every 2 hours as needed for throat pain.  If you develop any new or worsening symptoms return for reevaluation.      ED Prescriptions     Medication Sig Dispense Auth. Provider   amoxicillin-clavulanate (AUGMENTIN) 875-125 MG tablet Take 1 tablet by mouth every 12 (twelve) hours. 14 tablet Margarette Canada, NP      PDMP not reviewed this encounter.   Margarette Canada, NP 06/21/21 1313

## 2021-06-21 NOTE — ED Triage Notes (Signed)
Pt c/o sore throat, fatigue, body aches x4days. Pt denied any coughing or redness of her throat. Pt has been around her grand daughter who had the flu.

## 2021-11-17 ENCOUNTER — Ambulatory Visit (INDEPENDENT_AMBULATORY_CARE_PROVIDER_SITE_OTHER): Payer: Medicare HMO

## 2021-11-17 ENCOUNTER — Ambulatory Visit
Admission: EM | Admit: 2021-11-17 | Discharge: 2021-11-17 | Disposition: A | Discharge status: Home / Self Care | Payer: Medicare HMO | Attending: Family | Admitting: Family

## 2021-11-17 DIAGNOSIS — M545 Low back pain, unspecified: Secondary | ICD-10-CM | POA: Diagnosis not present

## 2021-11-17 DIAGNOSIS — M25551 Pain in right hip: Secondary | ICD-10-CM

## 2021-11-17 DIAGNOSIS — R350 Frequency of micturition: Secondary | ICD-10-CM

## 2021-11-17 DIAGNOSIS — M5441 Lumbago with sciatica, right side: Secondary | ICD-10-CM

## 2021-11-17 DIAGNOSIS — R39198 Other difficulties with micturition: Secondary | ICD-10-CM

## 2021-11-17 LAB — URINALYSIS, MICROSCOPIC (REFLEX): WBC, UA: NONE SEEN WBC/hpf (ref 0–5)

## 2021-11-17 LAB — URINALYSIS, ROUTINE W REFLEX MICROSCOPIC
Bilirubin Urine: NEGATIVE
Glucose, UA: NEGATIVE mg/dL
Ketones, ur: NEGATIVE mg/dL
Leukocytes,Ua: NEGATIVE
Nitrite: NEGATIVE
Protein, ur: NEGATIVE mg/dL
Specific Gravity, Urine: 1.015 (ref 1.005–1.030)
pH: 6.5 (ref 5.0–8.0)

## 2021-11-17 MED ORDER — KETOROLAC TROMETHAMINE 60 MG/2ML IM SOLN
60.0000 mg | Freq: Once | INTRAMUSCULAR | Status: AC
  Administered 2021-11-17: 60 mg via INTRAMUSCULAR

## 2021-11-17 MED ORDER — PREDNISONE 10 MG (21) PO TBPK: ORAL_TABLET | Freq: Every day | ORAL | 0 refills | Status: DC

## 2021-11-17 MED ORDER — NITROFURANTOIN MONOHYD MACRO 100 MG PO CAPS: 100.0000 mg | ORAL_CAPSULE | Freq: Two times a day (BID) | ORAL | 0 refills | Status: AC

## 2021-11-17 NOTE — Discharge Instructions (Signed)
You were given a shot of Toradol today to help with pain and inflammation. Recommend start oral Prednisone '10mg'$  tablets- take 6 tablets today and tomorrow and then decrease by 1 tablet every 2 days until finished on day 12- take with food. You should not need to take Mobic while you are on Prednisone. Recommend start Macrobid antibiotic twice a day as directed. We sent out a urine culture to the lab and will let you know if we need to stop or change your antibiotic. May apply warm compresses to right hip and back area for comfort. Follow-up pending urine culture results and in 2 to 3 days if not improving.  ?

## 2021-11-17 NOTE — ED Provider Notes (Signed)
?[facility] ? ? ? ?CSN: [id number] ?Arrival date & time: 11/17/21  0845 ? ? ?  ? ?History   ?Chief Complaint ?Chief Complaint  ?Patient presents with  ? Back Pain  ? Hip Pain  ? ? ?HPI ?Angela Stevens is a 56 y.o. female.  ? ?56 year old female presents with right hip pain that started about 1 week ago- getting worse with throbbing/burning pain. No distinct radiation of pain. Also having right lower back pain. Has noticed increase in bladder pressure and decrease in amount of urine. No fever, distinct dysuria or unusual vaginal discharge.No gross blood in her urine. Has history of UTI and bladder symptoms are similar to previous episode.  No previous episode of right hip pain or sciatica. Has taken Tylenol and applied Ben-gay to area with minimal relief. Other chronic health issues include mood disorder, GERD, anemia, Baker's cyst and reactive airway disease. Currently on Zoloft, Wellbutrin, Prilosec, Mobic daily and Allegra, Flonase, Pulmicort and Albuterol prn.  ? ?The history is provided by the patient.  ? ?Past Medical History:  ?Diagnosis Date  ? Anemia   ? Anxiety   ? Chronic bronchitis ([place])   ? COVID-19 01/26/2020  ? Depression   ? GERD (gastroesophageal reflux disease)   ? History of Helicobacter pylori infection   ? ? ?Patient Active Problem List  ? Diagnosis Date Noted  ? Globus sensation 09/30/2020  ? Pneumonia due to 2019 novel coronavirus 02/05/2020  ? 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 Prediabetes 04/24/2016  ? Obesity 10/28/2013  ? BPPV (benign paroxysmal positional vertigo) 03/04/2013  ? Fibroids 10/04/2012  ? Hearing loss 04/10/2012  ? Depression 03/28/2012  ? Reactive airway disease 12/25/2011  ? Hallux valgus with bunions 11/09/2011  ? 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 Gastroesophageal reflux disease without esophagitis 01/30/2011  ? IDA (iron deficiency anemia) 01/30/2011  ? ? ?Past Surgical History:  ?Procedure Laterality Date  ? NO PAST SURGERIES    ? ? ?OB History   ?No obstetric history  on file. ?  ? ? ? ?Home Medications   ? ?Prior to Admission medications   ?Medication Sig Start Date End Date Taking? Authorizing Provider  ?acetaminophen (TYLENOL) 500 MG tablet Take by mouth. 02/08/20  Yes [provider]  ?albuterol (PROVENTIL) (2.5 MG/3ML) 0.083% nebulizer solution Inhale into the lungs. 08/11/21 08/11/22 Yes [provider]  ?budesonide (PULMICORT) 1 MG/2ML nebulizer solution Inhale into the lungs. 02/05/20  Yes [provider]  ?buPROPion (WELLBUTRIN XL) 300 MG 24 hr tablet Take 1 tablet by mouth daily. 12/09/18  Yes [provider]  ?meloxicam (MOBIC) 15 MG tablet Take by mouth. 08/11/21  Yes [provider]  ?nitrofurantoin, macrocrystal-monohydrate, (MACROBID) 100 MG capsule Take 1 capsule (100 mg total) by mouth 2 (two) times daily for 5 days. 11/17/21 11/22/21 Yes Jennamarie Goings, Nicholes Stairs, NP  ?predniSONE (STERAPRED UNI-PAK 21 TAB) 10 MG (21) TBPK tablet Take by mouth daily. Take 6 tabs by mouth daily  for 2 days, then 5 tabs for 2 days, then 4 tabs for 2 days, then 3 tabs for 2 days, 2 tabs for 2 days, then 1 tab by mouth daily for 2 days 11/17/21  Yes Sagrario Lineberry, Nicholes Stairs, NP  ?sertraline (ZOLOFT) 50 MG tablet Take 1 tablet by mouth daily. 10/24/21 10/24/22 Yes [provider]  ?albuterol (VENTOLIN HFA) 108 (90 Base) MCG/ACT inhaler 2 puff(s) by inhalation every 4-6 hours prn 10/24/18   [provider]  ?clonazePAM (KLONOPIN) 0.5 MG tablet Take 0.5 mg  by mouth daily as needed. 01/05/20   [provider]  ?estradiol (ESTRACE) 0.1 MG/GM vaginal cream Place 0.5 g vaginally 2 (two) times a week. 10/26/21   [provider]  ?fexofenadine (ALLEGRA) 180 MG tablet Take 1 tablet by mouth daily as needed.    [provider]  ?fluticasone (FLONASE) 50 MCG/ACT nasal spray Place 2 sprays into the nose daily as needed. 07/29/18   [provider]  ?fluticasone (FLOVENT HFA) 110 MCG/ACT inhaler Inhale 1 puff into the lungs 2 (two) times  daily. 03/24/15   [provider]  ?omeprazole (PRILOSEC) 40 MG capsule Take 40 mg by mouth daily. 11/04/21   [provider]  ?polyethylene glycol powder (GLYCOLAX/MIRALAX) 17 GM/SCOOP powder as needed.    [provider]  ?promethazine (PHENERGAN) 25 MG tablet Take by mouth.    [provider]  ?sucralfate (CARAFATE) 1 g tablet Take 1 tablet by mouth at bedtime. 10/13/19   [provider]  ?triamcinolone ointment (KENALOG) 0.1 % Apply topically 2 (two) times daily. 12/29/19   [provider]  ? ? ?Family History ?Family History  ?Problem Relation Age of Onset  ? Alzheimer's disease Mother   ? Diabetes Mother   ? Kidney failure Father   ? ? ?Social History ?Social History  ? ?Tobacco Use  ? Smoking status: Never  ? Smokeless tobacco: Never  ?Vaping Use  ? Vaping Use: Never used  ?Substance Use Topics  ? Alcohol use: Not Currently  ? Drug use: Never  ? ? ? ?Allergies   ?Patient has no known allergies. ? ? ?Review of Systems ?Review of Systems  ?Constitutional:  Positive for activity change and fatigue. Negative for chills, diaphoresis and fever.  ?Respiratory:  Negative for cough and chest tightness.   ?Gastrointestinal:  Positive for nausea. Negative for vomiting.  ?Genitourinary:  Positive for decreased urine volume, flank pain and frequency. Negative for difficulty urinating, dysuria, hematuria and vaginal discharge.  ?Musculoskeletal:  Positive for arthralgias, back pain and myalgias. Negative for neck pain and neck stiffness.  ?Skin:  Negative for color change and rash.  ?Allergic/Immunologic: Positive for environmental allergies. Negative for food allergies.  ?Neurological:  Negative for dizziness, tremors, seizures, syncope, facial asymmetry, speech difficulty and numbness.  ?Hematological:  Negative for adenopathy. Does not bruise/bleed easily.  ? ? ?Physical Exam ?Triage Vital Signs ?ED Triage Vitals  ?Enc Vitals Group  ?   BP 11/17/21 0905 116/76  ?   Pulse  Rate 11/17/21 0905 62  ?   Resp 11/17/21 0905 18  ?   Temp 11/17/21 0905 99.6 ?F (37.6 ?C)  ?   Temp Source 11/17/21 0905 Oral  ?   SpO2 11/17/21 0905 99 %  ?   Weight 11/17/21 0903 206 lb (93.4 kg)  ?   Height 11/17/21 0903 '5\' 3"'$  (1.6 m)  ?   Head Circumference --   ?   Peak Flow --   ?   Pain Score 11/17/21 0900 10  ?   Pain Loc --   ?   Pain Edu? --   ?   Excl. in [place]? --   ? ?No data found. ? ?Updated Vital Signs ?BP 116/76 (BP Location: Left Arm)   Pulse 62   Temp 99.6 ?F (37.6 ?C) (Oral)   Resp 18   Ht '5\' 3"'$  (1.6 m)   Wt 206 lb (93.4 kg)   SpO2 99%   BMI 36.49 kg/m?  ? ?Visual Acuity ?Right Eye Distance:   ?  Left Eye Distance:   ?Bilateral Distance:   ? ?Right Eye Near:   ?Left Eye Near:    ?Bilateral Near:    ? ?Physical Exam ?Vitals and nursing note reviewed.  ?Constitutional:   ?   General: She is awake. She is not in acute distress. ?   Appearance: She is well-developed and well-groomed.  ?   Comments: She is sitting quietly on the exam table in no acute distress but appears tired and uncomfortable due to pain.   ?HENT:  ?   Head: Normocephalic and atraumatic.  ?   Right Ear: Hearing normal.  ?   Left Ear: Hearing normal.  ?Eyes:  ?   Extraocular Movements: Extraocular movements intact.  ?   Conjunctiva/sclera: Conjunctivae normal.  ?Cardiovascular:  ?   Rate and Rhythm: Normal rate and regular rhythm.  ?   Heart sounds: Normal heart sounds. No murmur heard. ?Pulmonary:  ?   Effort: Pulmonary effort is normal. No respiratory distress.  ?   Breath sounds: Normal breath sounds and air entry. No decreased air movement. No decreased breath sounds, wheezing, rhonchi or rales.  ?Abdominal:  ?   General: Abdomen is flat.  ?   Palpations: Abdomen is soft.  ?   Tenderness: There is right CVA tenderness. There is no guarding.  ?Musculoskeletal:     ?   General: Tenderness present. No swelling.  ?   Cervical back: Normal range of motion.  ?   Thoracic back: Tenderness (right CVA) present. Normal range of motion.   ?   Lumbar back: Tenderness present. No swelling. Normal range of motion. Negative right straight leg raise test and negative left straight leg raise test.  ?     Back: ? ?   Right hip: Tenderness present.

## 2021-11-17 NOTE — ED Triage Notes (Signed)
Patient is here for "Lower back pain, extending to right hip". No previous/recent injury. Pain started "last week in hip, then back thereafter". History of UTI with this pain. No dysuria. No fever. Smaller streams with voiding.  ?

## 2021-11-19 LAB — URINE CULTURE

## 2021-12-31 ENCOUNTER — Ambulatory Visit (INDEPENDENT_AMBULATORY_CARE_PROVIDER_SITE_OTHER): Payer: Medicare HMO

## 2021-12-31 ENCOUNTER — Ambulatory Visit
Admission: EM | Admit: 2021-12-31 | Discharge: 2021-12-31 | Disposition: A | Discharge status: Home / Self Care | Payer: Medicare HMO | Attending: Physician Assistant | Admitting: Physician Assistant

## 2021-12-31 ENCOUNTER — Encounter: Payer: Self-pay | Admitting: Emergency Medicine

## 2021-12-31 DIAGNOSIS — N3001 Acute cystitis with hematuria: Secondary | ICD-10-CM | POA: Diagnosis present

## 2021-12-31 DIAGNOSIS — R109 Unspecified abdominal pain: Secondary | ICD-10-CM

## 2021-12-31 DIAGNOSIS — R3129 Other microscopic hematuria: Secondary | ICD-10-CM | POA: Diagnosis not present

## 2021-12-31 DIAGNOSIS — R300 Dysuria: Secondary | ICD-10-CM | POA: Diagnosis present

## 2021-12-31 DIAGNOSIS — M545 Low back pain, unspecified: Secondary | ICD-10-CM | POA: Insufficient documentation

## 2021-12-31 LAB — URINALYSIS, ROUTINE W REFLEX MICROSCOPIC
Bilirubin Urine: NEGATIVE
Glucose, UA: NEGATIVE mg/dL
Ketones, ur: NEGATIVE mg/dL
Nitrite: NEGATIVE
Protein, ur: NEGATIVE mg/dL
Specific Gravity, Urine: 1.025 (ref 1.005–1.030)
pH: 6 (ref 5.0–8.0)

## 2021-12-31 LAB — URINALYSIS, MICROSCOPIC (REFLEX)

## 2021-12-31 MED ORDER — CEPHALEXIN 500 MG PO CAPS: 500.0000 mg | ORAL_CAPSULE | Freq: Two times a day (BID) | ORAL | 0 refills | Status: AC

## 2021-12-31 MED ORDER — PHENAZOPYRIDINE HCL 200 MG PO TABS: 200.0000 mg | ORAL_TABLET | Freq: Three times a day (TID) | ORAL | 0 refills | Status: DC

## 2021-12-31 MED ORDER — NAPROXEN 500 MG PO TABS: 500.0000 mg | ORAL_TABLET | Freq: Two times a day (BID) | ORAL | 0 refills | Status: AC | PRN

## 2021-12-31 NOTE — ED Triage Notes (Signed)
Patient c/o lower back pain, dysuria, and urinary urgency that started on Wed.  Patient states that she saw her PCP on Wed and the culture came back contaminated.

## 2021-12-31 NOTE — ED Provider Notes (Signed)
[facility]    CSN: [id number] Arrival date & time: 12/31/21  0820      History   Chief Complaint Chief Complaint  Patient presents with   Back Pain    HPI Angela Stevens is a 56 y.o. female presenting for 4-day history of left-sided lower back pain with radiation to the left lower abdomen.  Patient also reports associated urinary frequency and urgency as well as some difficulty urinating but denies painful urination, gross hematuria, fever, chills.  Denies nausea/vomiting.  Patient says she has been constipated.  Last BM was 5 days ago.  Reports that is not unusual for her and she has been taking MiraLAX.  Patient denies radiation of pain down her extremities and no associated numbness, weakness or tingling.  No reports of saddle anesthesia.  No falls or injuries.  Patient saw her primary care provider couple days ago and had a urinalysis performed.  She was told there was blood in the urine and the culture showed contamination and they suggested recollection.  She is not presently being treated for a UTI.  She has no history of nephrolithiasis.  Has taken Aleve for the pain which she says has been mildly helpful today.  Medical history significant for GERD, anxiety, depression, obesity.  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      Home Medications    Prior to Admission medications   Medication Sig Start Date End Date Taking? Authorizing Provider  cephALEXin (KEFLEX) 500 MG capsule Take 1 capsule (500 mg total) by mouth 2 (two) times daily for 7 days. 12/31/21 01/07/22 Yes Laurene Footman B, PA-C  naproxen (NAPROSYN) 500 MG tablet Take 1 tablet (500 mg total) by mouth 2 (two) times daily as needed for up to 10 days. 12/31/21 01/10/22 Yes Danton Clap, PA-C  phenazopyridine (PYRIDIUM) 200 MG tablet Take 1 tablet (200 mg total) by mouth 3 (three) times daily. 12/31/21  Yes Laurene Footman B, PA-C  acetaminophen (TYLENOL) 500 MG tablet Take by mouth. 02/08/20   [provider]  albuterol (PROVENTIL) (2.5 MG/3ML) 0.083% nebulizer solution Inhale into the lungs. 08/11/21 08/11/22  [provider]  albuterol (VENTOLIN HFA) 108 (90 Base) MCG/ACT inhaler 2 puff(s) by inhalation every 4-6 hours prn 10/24/18   [provider]  budesonide (PULMICORT) 1 MG/2ML nebulizer solution Inhale into the lungs. 02/05/20   [provider]  buPROPion (WELLBUTRIN XL) 300 MG 24 hr tablet Take 1 tablet by mouth daily. 12/09/18   [provider]  clonazePAM (KLONOPIN) 0.5 MG tablet Take 0.5 mg by  mouth daily as needed. 01/05/20   [provider]  estradiol (ESTRACE) 0.1 MG/GM vaginal cream Place 0.5 g vaginally 2 (two) times a week. 10/26/21   [provider]  fexofenadine (ALLEGRA) 180 MG tablet Take 1 tablet by mouth daily as needed.    [provider]  fluticasone (FLONASE) 50 MCG/ACT nasal spray Place 2 sprays into the nose daily as needed. 07/29/18   [provider]  fluticasone (FLOVENT HFA) 110 MCG/ACT inhaler Inhale 1 puff into the lungs 2 (two) times daily.  03/24/15   [provider]  omeprazole (PRILOSEC) 40 MG capsule Take 40 mg by mouth daily. 11/04/21   [provider]  polyethylene glycol powder (GLYCOLAX/MIRALAX) 17 GM/SCOOP powder as needed.    [provider]  predniSONE (STERAPRED UNI-PAK 21 TAB) 10 MG (21) TBPK tablet Take by mouth daily. Take 6 tabs by mouth daily  for 2 days, then 5 tabs for 2 days, then 4 tabs for 2 days, then 3 tabs for 2 days, 2 tabs for 2 days, then 1 tab by mouth daily for 2 days 11/17/21   Katy Apo, NP  promethazine (PHENERGAN) 25 MG tablet Take by mouth.    [provider]  sertraline (ZOLOFT) 50 MG tablet Take 1 tablet by mouth daily. 10/24/21 10/24/22  [provider]  sucralfate (CARAFATE) 1 g tablet Take 1 tablet by mouth at bedtime. 10/13/19   [provider]  triamcinolone ointment (KENALOG) 0.1 % Apply topically 2 (two) times daily. 12/29/19   [provider]    Family History Family History  Problem Relation Age of Onset   Alzheimer's disease Mother    Diabetes Mother    Kidney failure Father     Social History Social History   Tobacco Use   Smoking status: Never   Smokeless tobacco: Never  Vaping Use   Vaping Use: Never used  Substance Use Topics   Alcohol use: Not Currently   Drug use: Never     Allergies   Patient has no known allergies.   Review of Systems Review of Systems  Constitutional:  Negative for fatigue and fever.  Respiratory:  Negative for shortness of breath.   Cardiovascular:  Negative for chest pain.  Gastrointestinal:  Positive for abdominal pain and constipation. Negative for blood in stool, diarrhea, nausea and vomiting.  Genitourinary:  Positive for difficulty urinating, frequency and urgency. Negative for dysuria, flank pain, hematuria and vaginal discharge.  Musculoskeletal:  Positive for back pain. Negative for gait problem.  Skin:  Negative for color change, rash and wound.  Neurological:   Negative for weakness and numbness.     Physical Exam Triage Vital Signs ED Triage Vitals  Enc Vitals Group     BP      Pulse      Resp      Temp      Temp src      SpO2      Weight      Height      Head Circumference      Peak Flow      Pain Score      Pain Loc      Pain Edu?      Excl. in [place]?    No data found.  Updated Vital Signs BP 116/62 (BP Location: Right Arm)   Pulse (!) 56   Temp 98 F (36.7 C) (Oral)   Resp 14   Ht '5\' 3"'$  (1.6 m)  Wt 212 lb (96.2 kg)   SpO2 100%   BMI 37.55 kg/m      Physical Exam Vitals and nursing note reviewed.  Constitutional:      General: She is not in acute distress.    Appearance: Normal appearance. She is not ill-appearing or toxic-appearing.  HENT:     Head: Normocephalic and atraumatic.  Eyes:     General: No scleral icterus.       Right eye: No discharge.        Left eye: No discharge.     Conjunctiva/sclera: Conjunctivae normal.  Cardiovascular:     Rate and Rhythm: Regular rhythm. Bradycardia present.     Heart sounds: Normal heart sounds.  Pulmonary:     Effort: Pulmonary effort is normal. No respiratory distress.     Breath sounds: Normal breath sounds.  Abdominal:     Palpations: Abdomen is soft.     Tenderness: There is abdominal tenderness (LLQ). There is no right CVA tenderness or left CVA tenderness.  Musculoskeletal:     Cervical back: Neck supple.     Lumbar back: Tenderness present. No bony tenderness. Normal range of motion. Negative right straight leg raise test and negative left straight leg raise test.       Back:     Comments: TTP in area shown in picture.  Tenderness of left lumbar musculature.  Skin:    General: Skin is dry.  Neurological:     General: No focal deficit present.     Mental Status: She is alert. Mental status is at baseline.     Motor: No weakness.     Gait: Gait normal.  Psychiatric:        Mood and Affect: Mood normal.        Behavior: Behavior normal.        Thought  Content: Thought content normal.      UC Treatments / Results  Labs (all labs ordered are listed, but only abnormal results are displayed) Labs Reviewed  URINALYSIS, ROUTINE W REFLEX MICROSCOPIC - Abnormal; Notable for the following components:      Result Value   APPearance HAZY (*)    Hgb urine dipstick TRACE (*)    Leukocytes,Ua TRACE (*)    All other components within normal limits  URINALYSIS, MICROSCOPIC (REFLEX) - Abnormal; Notable for the following components:   Bacteria, UA FEW (*)    All other components within normal limits  URINE CULTURE    EKG   Radiology DG Abdomen 1 View  Result Date: 12/31/2021 CLINICAL DATA:  left low back and abdominal pain, microscopic hematuria, urinary urgency EXAM: ABDOMEN - 1 VIEW COMPARISON:  11/17/2021 FINDINGS: Stomach and small bowel are decompressed. Moderate proximal colonic fecal material, relatively decompressed distally. Stable bilateral pelvic phleboliths. No other abnormal abdominal calcifications. Minimal spurring in the lower lumbar spine. IMPRESSION: Nonobstructive bowel gas pattern. No radiographic evidence of urolithiasis. Electronically Signed   By: Lucrezia Europe M.D.   On: 12/31/2021 09:38    Procedures Procedures (including critical care time)  Medications Ordered in UC Medications - No data to display  Initial Impression / Assessment and Plan / UC Course  I have reviewed the triage vital signs and the nursing notes.  Pertinent labs & imaging results that were available during my care of the patient were reviewed by me and considered in my medical decision making (see chart for details).  56 year old female presenting for left lower back pain with radiation to the left  lower abdomen with suspected associated urinary urgency/frequency and difficulty urinating for the past 4 days.  No associated fevers, nausea/vomiting.  No numbness/tingling or leg weakness.  No radiation of pain to extremities.  Pain somewhat relieved by  use of Aleve.  Patient is afebrile and overall well-appearing.  On exam she has tenderness palpation of the left lower quadrant mildly.  Also has tenderness palpation of the left lumbar musculature mild to moderate.  Negative straight leg raise.  Chest clear auscultation heart regular rhythm, bradycardic.  UA today shows hazy appearance of urine, trace hemoglobin and trace leukocytes.  Microscopic shows bacteria and mucus.  We will send urine for culture.  KUB obtained to assess for possible renal stone or bowel obstruction.  KUB without evidence of obstruction or visible urolithiasis.  Discussed results with patient.  Urinalysis could indicate evidence of UTI especially given her urinary symptoms.  We will treat for suspected UTI at this time with Keflex and encouraged increasing rest and fluid intake.  Sent naproxen as well for acute pain relief.  Advised patient to follow-up with her PCP especially if she is not improving over the next couple days so she may need a CT scan to assess for possible kidney stone further or possibly orthopedic referral if related to musculoskeletal back pain is not improving.   Final Clinical Impressions(s) / UC Diagnoses   Final diagnoses:  Acute left-sided low back pain without sciatica  Dysuria  Acute cystitis with hematuria     Discharge Instructions      -Your x-ray does not show any evidence of a kidney stone but as discussed that does not pick up on all kidney stones.  There is also no evidence of bowel obstruction. - The urine shows white blood cells and microscopic blood so this could indicate a urinary tract infection.  I am going to culture the urine and go ahead and treat for suspected UTI with Keflex at this time.  Also sent Pyridium to help with the symptoms.  Increase rest and fluid intake. - I have sent naproxen to help with pain.  You can also take Tylenol. - If you not feeling better in a couple of days or your culture is negative and you  continue to have symptoms, you may follow-up with your primary care provider to see if you need a CT scan to look for kidney stone further or other work-up.  Is also possible this is musculoskeletal type back pain to.  Try heat and ice to the back as well.     ED Prescriptions     Medication Sig Dispense Auth. Provider   cephALEXin (KEFLEX) 500 MG capsule Take 1 capsule (500 mg total) by mouth 2 (two) times daily for 7 days. 14 capsule Laurene Footman B, PA-C   phenazopyridine (PYRIDIUM) 200 MG tablet Take 1 tablet (200 mg total) by mouth 3 (three) times daily. 6 tablet Laurene Footman B, PA-C   naproxen (NAPROSYN) 500 MG tablet Take 1 tablet (500 mg total) by mouth 2 (two) times daily as needed for up to 10 days. 20 tablet Gretta Cool      PDMP not reviewed this encounter.   Laurene Footman B, PA-C 12/31/21 1000

## 2021-12-31 NOTE — Discharge Instructions (Addendum)
-  Your x-ray does not show any evidence of a kidney stone but as discussed that does not pick up on all kidney stones.  There is also no evidence of bowel obstruction. - The urine shows white blood cells and microscopic blood so this could indicate a urinary tract infection.  I am going to culture the urine and go ahead and treat for suspected UTI with Keflex at this time.  Also sent Pyridium to help with the symptoms.  Increase rest and fluid intake. - I have sent naproxen to help with pain.  You can also take Tylenol. - If you not feeling better in a couple of days or your culture is negative and you continue to have symptoms, you may follow-up with your primary care provider to see if you need a CT scan to look for kidney stone further or other work-up.  Is also possible this is musculoskeletal type back pain to.  Try heat and ice to the back as well.

## 2022-01-02 LAB — URINE CULTURE: Culture: 40000 — AB

## 2022-07-20 ENCOUNTER — Emergency Department
Admission: EM | Admit: 2022-07-20 | Discharge: 2022-07-20 | Disposition: A | Discharge status: Home / Self Care | Payer: 59 | Attending: Emergency Medicine | Admitting: Emergency Medicine

## 2022-07-20 ENCOUNTER — Emergency Department: Payer: 59

## 2022-07-20 ENCOUNTER — Other Ambulatory Visit: Payer: Self-pay

## 2022-07-20 DIAGNOSIS — M549 Dorsalgia, unspecified: Secondary | ICD-10-CM | POA: Diagnosis present

## 2022-07-20 DIAGNOSIS — Y92410 Unspecified street and highway as the place of occurrence of the external cause: Secondary | ICD-10-CM | POA: Insufficient documentation

## 2022-07-20 DIAGNOSIS — S8002XA Contusion of left knee, initial encounter: Secondary | ICD-10-CM | POA: Insufficient documentation

## 2022-07-20 DIAGNOSIS — S39012A Strain of muscle, fascia and tendon of lower back, initial encounter: Secondary | ICD-10-CM | POA: Diagnosis not present

## 2022-07-20 MED ORDER — KETOROLAC TROMETHAMINE 30 MG/ML IJ SOLN
30.0000 mg | Freq: Once | INTRAMUSCULAR | Status: AC
  Administered 2022-07-20: 30 mg via INTRAMUSCULAR
  Filled 2022-07-20: qty 1

## 2022-07-20 MED ORDER — NAPROXEN 500 MG PO TABS
500.0000 mg | ORAL_TABLET | Freq: Two times a day (BID) | ORAL | 2 refills | Status: AC
Start: 2022-07-20 — End: ?
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
## 2022-07-20 NOTE — ED Notes (Signed)
Registration staff with pt. Pt in NAD.

## 2022-07-20 NOTE — ED Notes (Signed)
Will give Toradol once pt back to T1 as is currently not present.
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
## 2022-08-02 ENCOUNTER — Ambulatory Visit (INDEPENDENT_AMBULATORY_CARE_PROVIDER_SITE_OTHER): Payer: 59

## 2022-08-02 ENCOUNTER — Encounter: Payer: Self-pay | Admitting: Emergency Medicine

## 2022-08-02 ENCOUNTER — Ambulatory Visit
Admission: EM | Admit: 2022-08-02 | Discharge: 2022-08-02 | Disposition: A | Discharge status: Home / Self Care | Payer: 59 | Attending: Physician Assistant | Admitting: Physician Assistant

## 2022-08-02 DIAGNOSIS — S161XXA Strain of muscle, fascia and tendon at neck level, initial encounter: Secondary | ICD-10-CM

## 2022-08-02 DIAGNOSIS — M25562 Pain in left knee: Secondary | ICD-10-CM

## 2022-08-02 NOTE — Discharge Instructions (Addendum)
KNEE: No fractures in the knee or lower leg. 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  This may be flared up causing your pain.  Continue with the naproxen, ice and elevation.  NECK PAIN: Stressed avoiding painful activities. This can exacerbate your symptoms and make them worse.  May apply heat to the areas of pain for some relief. Use medications as directed. Be aware of which medications make you drowsy and do not drive or operate any kind of heavy machinery while using the medication (ie pain medications or muscle relaxers). F/U with PCP for reexamination or return sooner if condition worsens or does not begin to improve over the next few days.   NECK PAIN RED FLAGS: If symptoms get worse than they are right now, you should come back sooner for re-evaluation. If you have increased numbness/ tingling or notice that the numbness/tingling is affecting the legs or saddle region, go to ER. If you ever lose continence go to ER.
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
## 2022-09-19 ENCOUNTER — Ambulatory Visit (INDEPENDENT_AMBULATORY_CARE_PROVIDER_SITE_OTHER): Payer: 59

## 2022-09-19 ENCOUNTER — Ambulatory Visit
Admission: EM | Admit: 2022-09-19 | Discharge: 2022-09-19 | Disposition: A | Discharge status: Home / Self Care | Payer: 59 | Attending: Physician Assistant | Admitting: Physician Assistant

## 2022-09-19 DIAGNOSIS — S99921A Unspecified injury of right foot, initial encounter: Secondary | ICD-10-CM | POA: Diagnosis not present

## 2022-09-19 DIAGNOSIS — M79671 Pain in right foot: Secondary | ICD-10-CM

## 2022-09-19 NOTE — ED Triage Notes (Signed)
Pt reports pain on the right heel of foot X 3 weeks.  States she also dropped a play station on her right foot last week and is concerned of a knot on the top of her foot.

## 2022-09-19 NOTE — Discharge Instructions (Addendum)
-  No fractures. -Try to take 1-2 Aleve twice daily as needed for pain and inflammation.  Also consider use of Voltaren gel, ice, Tylenol. - Keep your appointment with orthopedics in about a week and see if he can discuss this with them.  You have a couple of heel spurs which might be causing your pain.  As discussed, sometimes those are injected with corticosteroid medication that can be helpful.  You have a condition requiring you to follow up with Orthopedics so please call one of the following office for appointment:   [facility] [address] [place], [place] [address] Phone: [phone]  [facility] [address], [place], [place] [address] Phone: [phone]

## 2022-09-19 NOTE — ED Provider Notes (Signed)
[facility]    CSN: [id number] Arrival date & time: 09/19/22  [id number]      History   Chief Complaint Chief Complaint  Patient presents with   Foot Pain    HPI Angela Stevens is a 57 y.o. female presenting for right foot pain.  She says the right heel has been hurting for about 3 weeks.  She says last week she dropped a PlayStation game on her foot and has some swelling on the top of her foot and increased pain.  Denies numbness/tingling or weakness.  No falls.  No other injuries reported.  Has tried over the counter without relief.  Reports that she has had surgery on this foot to remove a bunion in the past.  She does have an appointment to discuss a bunion on the left foot in about a week with orthopedics.  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      Home Medications    Prior to Admission medications   Medication Sig Start Date End Date Taking? Authorizing Provider  acetaminophen (TYLENOL) 500 MG tablet Take by mouth. 02/08/20   [provider]  albuterol (PROVENTIL) (2.5 MG/3ML) 0.083%  nebulizer solution Inhale into the lungs. 08/11/21 08/11/22  [provider]  albuterol (VENTOLIN HFA) 108 (90 Base) MCG/ACT inhaler 2 puff(s) by inhalation every 4-6 hours prn 10/24/18   [provider]  budesonide (PULMICORT) 1 MG/2ML nebulizer solution Inhale into the lungs. 02/05/20   [provider]  buPROPion (WELLBUTRIN XL) 300 MG 24 hr tablet Take 1 tablet by mouth daily. 12/09/18   [provider]  clonazePAM (KLONOPIN) 0.5 MG tablet Take 0.5 mg by mouth daily as needed. 01/05/20   [provider]  estradiol (ESTRACE) 0.1 MG/GM vaginal cream Place 0.5 g vaginally 2 (two) times a week. 10/26/21   [provider]  fexofenadine (ALLEGRA) 180 MG tablet Take 1 tablet by mouth daily as needed.    [provider]  fluticasone (FLONASE) 50 MCG/ACT nasal spray Place 2 sprays into the nose daily as needed. 07/29/18   [provider]  fluticasone (FLOVENT HFA) 110 MCG/ACT inhaler Inhale 1 puff into the lungs 2 (two) times daily. 03/24/15   [provider]  naproxen (NAPROSYN) 500 MG tablet Take 1 tablet (500 mg total) by mouth 2 (two) times daily with a meal. 07/20/22   Lavonia Drafts, MD  omeprazole (PRILOSEC) 40 MG capsule Take 40 mg by mouth daily. 11/04/21   [provider]  phenazopyridine (PYRIDIUM) 200 MG tablet Take 1 tablet (200 mg total) by  mouth 3 (three) times daily. 12/31/21   Laurene Footman B, PA-C  polyethylene glycol powder (GLYCOLAX/MIRALAX) 17 GM/SCOOP powder as needed.    [provider]  predniSONE (STERAPRED UNI-PAK 21 TAB) 10 MG (21) TBPK tablet Take by mouth daily. Take 6 tabs by mouth daily  for 2 days, then 5 tabs for 2 days, then 4 tabs for 2 days, then 3 tabs for 2 days, 2 tabs for 2 days, then 1 tab by mouth daily for 2 days 11/17/21   Katy Apo, NP  promethazine (PHENERGAN) 25 MG tablet Take by mouth.    [provider]  sertraline (ZOLOFT) 50 MG tablet Take 1 tablet by mouth  daily. 10/24/21 10/24/22  [provider]  sucralfate (CARAFATE) 1 g tablet Take 1 tablet by mouth at bedtime. 10/13/19   [provider]  triamcinolone ointment (KENALOG) 0.1 % Apply topically 2 (two) times daily. 12/29/19   [provider]    Family History Family History  Problem Relation Age of Onset   Alzheimer's disease Mother    Diabetes Mother    Kidney failure Father     Social History Social History   Tobacco Use   Smoking status: Never   Smokeless tobacco: Never  Vaping Use   Vaping Use: Never used  Substance Use Topics   Alcohol use: Not Currently   Drug use: Never     Allergies   Patient has no known allergies.   Review of Systems Review of Systems  Musculoskeletal:  Positive for arthralgias and joint swelling. Negative for gait problem.  Skin:  Negative for color change and wound.  Neurological:  Negative for weakness and numbness.     Physical Exam Triage Vital Signs ED Triage Vitals  Enc Vitals Group     BP      Pulse      Resp      Temp      Temp src      SpO2      Weight      Height      Head Circumference      Peak Flow      Pain Score      Pain Loc      Pain Edu?      Excl. in [place]?    No data found.  Updated Vital Signs BP 96/64 (BP Location: Right Arm)   Pulse 66   Temp 98.6 F (37 C) (Oral)   Resp 17   SpO2 98%       Physical Exam Vitals and nursing note reviewed.  Constitutional:      General: She is not in acute distress.    Appearance: Normal appearance. She is not ill-appearing or toxic-appearing.  HENT:     Head: Normocephalic and atraumatic.  Eyes:     General: No scleral icterus.       Right eye: No discharge.        Left eye: No discharge.     Conjunctiva/sclera: Conjunctivae normal.  Cardiovascular:     Rate and Rhythm: Normal rate and regular rhythm.     Pulses: Normal pulses.  Pulmonary:     Effort: Pulmonary effort is normal. No respiratory distress.  Musculoskeletal:      Cervical back: Neck supple.     Right foot: Swelling (mild swelling dorsal midfoot with TTP) and tenderness (calcaneus and dorsal midfoot) present. Normal pulse.  Skin:    General: Skin is dry.  Neurological:  General: No focal deficit present.     Mental Status: She is alert. Mental status is at baseline.     Motor: No weakness.     Gait: Gait normal.  Psychiatric:        Mood and Affect: Mood normal.        Behavior: Behavior normal.        Thought Content: Thought content normal.      UC Treatments / Results  Labs (all labs ordered are listed, but only abnormal results are displayed) Labs Reviewed - No data to display  EKG   Radiology DG Foot Complete Right  Result Date: 09/19/2022 CLINICAL DATA:  Dropped something on foot 1 week ago. Pain in the right heel for 3 weeks. EXAM: RIGHT FOOT COMPLETE - 3+ VIEW COMPARISON:  None Available. FINDINGS: Postsurgical changes are noted in the first toe proximal phalanx, first metatarsal, and head of second metatarsal. There is no evidence of hardware fracture or perihardware lucency. There is no acute fracture or dislocation. Bony alignment is normal. There is mild degenerative change of the great toe metatarsophalangeal joint. The other joint spaces are preserved. There is no erosive change. The soft tissues are unremarkable. IMPRESSION: 1. No evidence of acute injury in the foot. 2. Mild degenerative change of the first metatarsophalangeal joint. Electronically Signed   By: Valetta Mole M.D.   On: 09/19/2022 09:01    Procedures Procedures (including critical care time)  Medications Ordered in UC Medications - No data to display  Initial Impression / Assessment and Plan / UC Course  I have reviewed the triage vital signs and the nursing notes.  Pertinent labs & imaging results that were available during my care of the patient were reviewed by me and considered in my medical decision making (see chart for details).   57 year old  female presents for right heel pain and right dorsal foot pain.  Heel pain began 3 weeks ago without injury.  Dorsal foot pain started last week after she dropped a PlayStation on the foot.  X-ray of right foot obtained today.  No fractures noted.  She does have mild degenerative change of the first MTP joint but she is not having any tenderness in the region.  Most of the tenderness is at the dorsal midfoot where there is a small lump and of the heel.  No tenderness of the plantar fascia.  On the x-ray does look like she has very mild heel spurs but this is not mentioned in the x-ray report.  Suspect her heel pain could be related to the heel spurs.  Advised discussing this with orthopedics next week at the appointment.  In the meantime advised her to start taking Aleve and using Voltaren gel as well as icing the foot.   Final Clinical Impressions(s) / UC Diagnoses   Final diagnoses:  Right foot pain  Injury of right foot, initial encounter     Discharge Instructions      -No fractures. -Try to take 1-2 Aleve twice daily as needed for pain and inflammation.  Also consider use of Voltaren gel, ice, Tylenol. - Keep your appointment with orthopedics in about a week and see if he can discuss this with them.  You have a couple of heel spurs which might be causing your pain.  As discussed, sometimes those are injected with corticosteroid medication that can be helpful.  You have a condition requiring you to follow up with Orthopedics so please call one of the following office  for appointment:   [facility] [place], [place], [place] [address] Phone: [phone]  [facility] [address], [place], [place] [address] Phone: [phone]      ED Prescriptions   None    PDMP not reviewed this encounter.   Danton Clap, PA-C 09/19/22 [phone]

## 2022-09-26 DIAGNOSIS — M79674 Pain in right toe(s): Secondary | ICD-10-CM | POA: Insufficient documentation

## 2022-09-26 DIAGNOSIS — M7751 Other enthesopathy of right foot: Secondary | ICD-10-CM | POA: Insufficient documentation

## 2022-09-26 DIAGNOSIS — M722 Plantar fascial fibromatosis: Secondary | ICD-10-CM | POA: Insufficient documentation

## 2022-09-26 DIAGNOSIS — M19071 Primary osteoarthritis, right ankle and foot: Secondary | ICD-10-CM | POA: Insufficient documentation

## 2022-12-24 IMAGING — CR DG ABDOMEN 1V
1 series · 1 of 1 positions shown · non-contrast
Comparison: 11/17/2021

CLINICAL DATA: left low back and abdominal pain, microscopic
hematuria, urinary urgency

EXAM:
ABDOMEN - 1 VIEW

[abdomen kub]
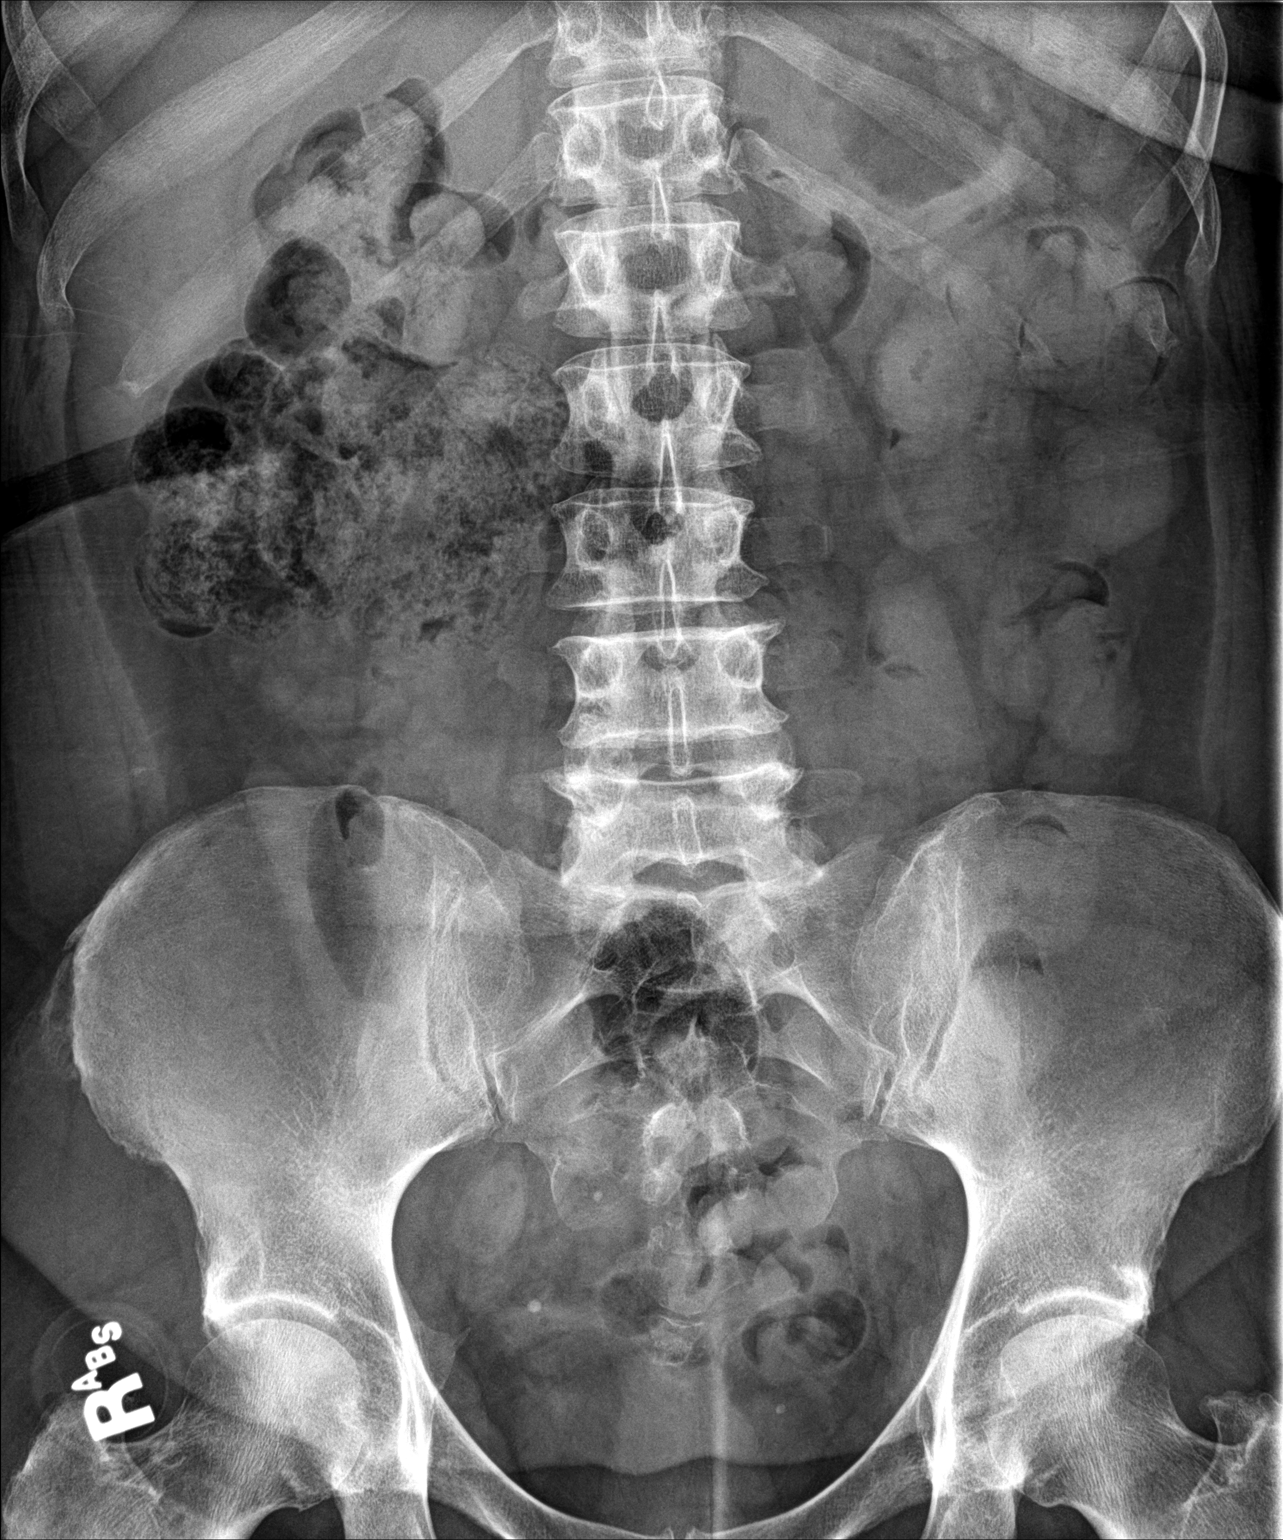

[1 of 1 positions shown; findings below may reference images not displayed]

FINDINGS: Stomach and small bowel are decompressed. Moderate proximal colonic
fecal material, relatively decompressed distally. Stable bilateral
pelvic phleboliths. No other abnormal abdominal calcifications.
Minimal spurring in the lower lumbar spine.
IMPRESSION: Nonobstructive bowel gas pattern.

No radiographic evidence of urolithiasis.

## 2023-04-26 ENCOUNTER — Ambulatory Visit
Admission: EM | Admit: 2023-04-26 | Discharge: 2023-04-26 | Disposition: A | Discharge status: Home / Self Care | Payer: 59 | Attending: Emergency Medicine | Admitting: Emergency Medicine

## 2023-04-26 DIAGNOSIS — K2100 Gastro-esophageal reflux disease with esophagitis, without bleeding: Secondary | ICD-10-CM

## 2023-04-26 DIAGNOSIS — R079 Chest pain, unspecified: Secondary | ICD-10-CM | POA: Diagnosis not present

## 2023-04-26 MED ORDER — FAMOTIDINE 20 MG PO TABS: 20.0000 mg | ORAL_TABLET | Freq: Two times a day (BID) | ORAL | 0 refills | Status: AC

## 2023-04-26 MED ORDER — PANTOPRAZOLE SODIUM 40 MG PO TBEC
40.0000 mg | DELAYED_RELEASE_TABLET | Freq: Every day | ORAL | 0 refills | Status: AC
Start: 2023-04-26 — End: ?

## 2023-04-26 NOTE — ED Provider Notes (Signed)
HPI  SUBJECTIVE:  Angela Stevens is a 57 y.o. female who presents with a week of belching, throat burning, waterbrash, decreased appetite, sore throat, hoarse voice and sharp seconds long epigastric pain present only with eating.  Mild dry cough.  She denies chest pain, pressure or heaviness.  No nausea, vomiting, fevers, abdominal distention.  No wheezing, shortness of breath. she tried Mylanta, baking soda, Metamucil and is taking 40 mg of omeprazole prescribed to her for this without improvement in her symptoms.  Symptoms are better when she sits up, worse when she lies down and with eating.  There is no exertional component to it.  She states is identical to previous GERD.  She has a standing history of GERD, H. pylori infection that was treated 1 year ago.  No history of diabetes, hypertension of coronary disease, MI, PAD/PVD, CVA, smoking, hypercholesterolemia atrial fibrillation, mesenteric ischemia.  Family history negative for early MI.  PCP: [facility].    Past Medical History:  Diagnosis Date   Anemia    Anxiety    Chronic bronchitis (HCC)    COVID-19 01/26/2020   Depression    GERD (gastroesophageal reflux disease)    History of Helicobacter pylori infection     Past Surgical History:  Procedure Laterality Date   NO PAST SURGERIES      Family History  Problem Relation Age of Onset   Alzheimer's disease Mother    Diabetes Mother    Kidney failure Father     Social History   Tobacco Use   Smoking status: Never   Smokeless tobacco: Never  Vaping Use   Vaping status: Never Used  Substance Use Topics   Alcohol use: Not Currently   Drug use: Never    No current facility-administered medications for this encounter.  Current Outpatient Medications:    acetaminophen (TYLENOL) 500 MG tablet, Take by mouth., Disp: , Rfl:    albuterol (VENTOLIN HFA) 108 (90 Base) MCG/ACT inhaler, 2 puff(s) by inhalation every 4-6 hours prn, Disp: , Rfl:    budesonide (PULMICORT) 1  MG/2ML nebulizer solution, Inhale into the lungs., Disp: , Rfl:    buPROPion (WELLBUTRIN XL) 300 MG 24 hr tablet, Take 1 tablet by mouth daily., Disp: , Rfl:    clonazePAM (KLONOPIN) 0.5 MG tablet, Take 0.5 mg by mouth daily as needed., Disp: , Rfl:    estradiol (ESTRACE) 0.1 MG/GM vaginal cream, Place 0.5 g vaginally 2 (two) times a week., Disp: , Rfl:    famotidine (PEPCID) 20 MG tablet, Take 1 tablet (20 mg total) by mouth 2 (two) times daily., Disp: 30 tablet, Rfl: 0   fexofenadine (ALLEGRA) 180 MG tablet, Take 1 tablet by mouth daily as needed., Disp: , Rfl:    fluticasone (FLONASE) 50 MCG/ACT nasal spray, Place 2 sprays into the nose daily as needed., Disp: , Rfl:    fluticasone (FLOVENT HFA) 110 MCG/ACT inhaler, Inhale 1 puff into the lungs 2 (two) times daily., Disp: , Rfl:    pantoprazole (PROTONIX) 40 MG tablet, Take 1 tablet (40 mg total) by mouth daily., Disp: 30 tablet, Rfl: 0   polyethylene glycol powder (GLYCOLAX/MIRALAX) 17 GM/SCOOP powder, as needed., Disp: , Rfl:    predniSONE (STERAPRED UNI-PAK 21 TAB) 10 MG (21) TBPK tablet, Take by mouth daily. Take 6 tabs by mouth daily  for 2 days, then 5 tabs for 2 days, then 4 tabs for 2 days, then 3 tabs for 2 days, 2 tabs for 2 days, then 1 tab  by mouth daily for 2 days, Disp: 42 tablet, Rfl: 0   promethazine (PHENERGAN) 25 MG tablet, Take by mouth., Disp: , Rfl:    sucralfate (CARAFATE) 1 g tablet, Take 1 tablet by mouth at bedtime., Disp: , Rfl:    triamcinolone ointment (KENALOG) 0.1 %, Apply topically 2 (two) times daily., Disp: , Rfl:    albuterol (PROVENTIL) (2.5 MG/3ML) 0.083% nebulizer solution, Inhale into the lungs., Disp: , Rfl:    sertraline (ZOLOFT) 50 MG tablet, Take 1 tablet by mouth daily., Disp: , Rfl:   No Known Allergies   ROS  As noted in HPI.   Physical Exam  BP 113/82 (BP Location: Left Arm)   Pulse 67   Temp 98.2 F (36.8 C) (Oral)   Ht 5\' 3"  (1.6 m)   Wt 92.5 kg   SpO2 99%   BMI 36.14 kg/m    Constitutional: Well developed, well nourished, no acute distress Eyes:  EOMI, conjunctiva normal bilaterally HENT: Normocephalic, atraumatic,mucus membranes moist Respiratory: Normal inspiratory effort, lungs clear bilaterally, good air movement. Cardiovascular: Normal rate, regular rhythm, no murmurs rubs or gallops GI: nondistended, soft, nontender, active bowel sounds.  No rebound or guarding. skin: No rash, skin intact Musculoskeletal: no deformities Neurologic: Alert & oriented x 3, no focal neuro deficits Psychiatric: Speech and behavior appropriate   ED Course   Medications - No data to display  Orders Placed This Encounter  Procedures   ED EKG    Standing Status:   Standing    Number of Occurrences:   1    Order Specific Question:   Reason for Exam    Answer:   Chest Pain    No results found for this or any previous visit (from the past 24 hour(s)). No results found.  ED Clinical Impression  1. Gastroesophageal reflux disease with esophagitis without hemorrhage      ED Assessment/Plan     Presentation most consistent with GERD.  She also may have a component of gastritis as she reports epigastric pain with eating.  However, we will check an EKG.  EKG: Sinus bradycardia, rate 58.  Normal axis, normal intervals.  No hypertrophy.  No ST-T wave changes.  No previous EKG for comparison. patient symptomatic while EKG obtained.  EKG normal.  Patient states that 40 mg of omeprazole is no longer working for her.  Will change her to 40 mg of pantoprazole her to take 30 to 60 minutes before breakfast.  Will also start her on some Pepcid.  Follow-up with PCP if no improvement.  May need to see GI for upper endoscopy.  ER return precautions given.  Discussed MDM, treatment plan, and plan for follow-up with patient. Discussed sn/sx that should prompt return to the ED. patient agrees with plan.   Meds ordered this encounter  Medications   pantoprazole (PROTONIX) 40 MG  tablet    Sig: Take 1 tablet (40 mg total) by mouth daily.    Dispense:  30 tablet    Refill:  0   famotidine (PEPCID) 20 MG tablet    Sig: Take 1 tablet (20 mg total) by mouth 2 (two) times daily.    Dispense:  30 tablet    Refill:  0      *This clinic note was created using Scientist, clinical (histocompatibility and immunogenetics). Therefore, there may be occasional mistakes despite careful proofreading.  ?    Domenick Gong, MD 04/26/23 1455

## 2023-04-26 NOTE — ED Triage Notes (Signed)
Pt c/o GERD x1week  Pt states that she has always had problems with GERD and she gets burning and gas every time she eats

## 2023-04-26 NOTE — Discharge Instructions (Signed)
Take the pantoprazole 30 to 60 minutes before breakfast to maximize its effect.  You can also take the Pepcid twice a day.  Your EKG was normal.  Follow-up with your primary care provider if not better in a week.  You may need to see GI.  To the ER for the signs and symptoms we discussed.

## 2023-07-14 ENCOUNTER — Encounter: Payer: Self-pay | Admitting: Emergency Medicine

## 2023-07-14 ENCOUNTER — Ambulatory Visit
Admission: EM | Admit: 2023-07-14 | Discharge: 2023-07-14 | Disposition: A | Discharge status: Home / Self Care | Payer: 59 | Attending: Family Medicine | Admitting: Family Medicine

## 2023-07-14 DIAGNOSIS — M545 Low back pain, unspecified: Secondary | ICD-10-CM | POA: Diagnosis present

## 2023-07-14 LAB — URINALYSIS, W/ REFLEX TO CULTURE (INFECTION SUSPECTED)
Bilirubin Urine: NEGATIVE
Glucose, UA: NEGATIVE mg/dL
Ketones, ur: NEGATIVE mg/dL
Leukocytes,Ua: NEGATIVE
Nitrite: NEGATIVE
Protein, ur: NEGATIVE mg/dL
Specific Gravity, Urine: <1.005 — ABNORMAL LOW (ref 1.005–1.030)
pH: 6 (ref 5.0–8.0)

## 2023-07-14 MED ORDER — MELOXICAM 15 MG PO TABS: 15.0000 mg | ORAL_TABLET | Freq: Every day | ORAL | 0 refills | Status: DC | PRN

## 2023-07-14 NOTE — Discharge Instructions (Addendum)
Urine with no evidence of infection.  Medication as directed.   Follow up with your PCP.

## 2023-07-14 NOTE — ED Triage Notes (Signed)
Pt c/o lower back pain, urinary frequency. Started about 3-4 days. She states she believes she has a UTI she has had one before and this is how it presented. Denies vaginal discharge.

## 2023-07-14 NOTE — ED Provider Notes (Signed)
[facility]    CSN: [id number] Arrival date & time: 07/14/23  1230      History   Chief Complaint Chief Complaint  Patient presents with   Back Pain    HPI 57 year old female presents with low back pain.  Patient reports that she has had symptoms over the past 3 to 4 days.  She reports urinary frequency.  Mild suprapubic tenderness.  No fever.  Patient reports that she has a history of UTI.  Denies vaginal discharge.  No relieving factors.  No other complaints.  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      Home Medications    Prior to Admission medications   Medication Sig Start Date End Date Taking? Authorizing Provider  albuterol (VENTOLIN HFA) 108 (90 Base) MCG/ACT inhaler 2 puff(s) by inhalation every 4-6 hours prn 10/24/18  Yes [provider]  budesonide (PULMICORT) 1 MG/2ML nebulizer solution Inhale into the lungs. 02/05/20  Yes [provider]  buPROPion (WELLBUTRIN XL) 300 MG 24 hr tablet Take 1 tablet by mouth daily. 12/09/18  Yes [provider]  clonazePAM (KLONOPIN) 0.5 MG  tablet Take 0.5 mg by mouth daily as needed. 01/05/20  Yes [provider]  estradiol (ESTRACE) 0.1 MG/GM vaginal cream Place 0.5 g vaginally 2 (two) times a week. 10/26/21  Yes [provider]  famotidine (PEPCID) 20 MG tablet Take 1 tablet (20 mg total) by mouth 2 (two) times daily. 04/26/23  Yes Domenick Gong, MD  fexofenadine (ALLEGRA) 180 MG tablet Take 1 tablet by mouth daily as needed.   Yes [provider]  fluticasone (FLONASE) 50 MCG/ACT nasal spray Place 2 sprays into the nose daily as needed. 07/29/18  Yes [provider]  fluticasone (FLOVENT HFA) 110 MCG/ACT inhaler Inhale 1 puff into the lungs 2 (two) times daily. 03/24/15  Yes [provider]  meloxicam (MOBIC) 15 MG tablet Take 1 tablet (15 mg total) by mouth daily as needed for pain. 07/14/23  Yes Tavon Corriher G, DO  pantoprazole (PROTONIX) 40 MG tablet Take 1 tablet (40 mg total) by mouth daily. 04/26/23  Yes Domenick Gong, MD  polyethylene glycol powder (GLYCOLAX/MIRALAX) 17 GM/SCOOP powder as needed.   Yes [provider]  promethazine (PHENERGAN) 25 MG tablet Take by mouth.   Yes [provider]  sucralfate (CARAFATE) 1 g tablet Take 1 tablet by mouth at bedtime. 10/13/19  Yes [provider]  acetaminophen (TYLENOL) 500 MG tablet Take by mouth. 02/08/20   [provider]  albuterol (PROVENTIL) (2.5 MG/3ML) 0.083% nebulizer solution Inhale into  the lungs. 08/11/21 08/11/22  [provider]  predniSONE (STERAPRED UNI-PAK 21 TAB) 10 MG (21) TBPK tablet Take by mouth daily. Take 6 tabs by mouth daily  for 2 days, then 5 tabs for 2 days, then 4 tabs for 2 days, then 3 tabs for 2 days, 2 tabs for 2 days, then 1 tab by mouth daily for 2 days 11/17/21   Sudie Grumbling, NP  sertraline (ZOLOFT) 50 MG tablet Take 1 tablet by mouth daily. 10/24/21 10/24/22  [provider]  triamcinolone ointment (KENALOG) 0.1 % Apply topically 2 (two) times daily.  12/29/19   [provider]    Family History Family History  Problem Relation Age of Onset   Alzheimer's disease Mother    Diabetes Mother    Kidney failure Father     Social History Social History   Tobacco Use   Smoking status: Never   Smokeless tobacco: Never  Vaping Use   Vaping status: Never Used  Substance Use Topics   Alcohol use: Not Currently   Drug use: Never     Allergies   Patient has no known allergies.   Review of Systems Review of Systems  Musculoskeletal:  Positive for back pain.   Physical Exam Triage Vital Signs ED Triage Vitals  Encounter Vitals Group     BP 07/14/23 1319 107/72     Systolic BP Percentile --      Diastolic BP Percentile --      Pulse Rate 07/14/23 1319 (!) 59     Resp 07/14/23 1319 16     Temp 07/14/23 1319 98.3 F (36.8 C)     Temp Source 07/14/23 1319 Oral     SpO2 07/14/23 1319 98 %     Weight 07/14/23 1318 206 lb (93.4 kg)     Height 07/14/23 1318 5\' 3"  (1.6 m)     Head Circumference --      Peak Flow --      Pain Score 07/14/23 1317 8     Pain Loc --      Pain Education --      Exclude from Growth Chart --    No data found.  Updated Vital Signs BP 107/72 (BP Location: Right Arm)   Pulse (!) 59   Temp 98.3 F (36.8 C) (Oral)   Resp 16   Ht 5\' 3"  (1.6 m)   Wt 93.4 kg   SpO2 98%   BMI 36.49 kg/m   Visual Acuity Right Eye Distance:   Left Eye Distance:   Bilateral Distance:    Right Eye Near:   Left Eye Near:    Bilateral Near:     Physical Exam Vitals and nursing note reviewed.  Constitutional:      General: She is not in acute distress.    Appearance: Normal appearance.  HENT:     Head: Normocephalic and atraumatic.  Cardiovascular:     Rate and Rhythm: Normal rate and regular rhythm.  Pulmonary:     Effort: Pulmonary effort is normal.     Breath sounds: Normal breath sounds. No wheezing or rales.  Abdominal:     General: There is no distension.     Palpations: Abdomen is soft.      Tenderness: There is no abdominal tenderness.  Neurological:     Mental Status: She is alert.      UC Treatments / Results  Labs (all labs ordered are listed, but only abnormal results are displayed) Labs Reviewed  URINALYSIS, W/ REFLEX TO CULTURE (INFECTION SUSPECTED) - Abnormal; Notable for the following components:      Result Value   Color, Urine STRAW (*)    Specific Gravity, Urine <1.005 (*)    Hgb urine dipstick TRACE (*)    Bacteria, UA RARE (*)    All other components within normal limits    EKG   Radiology No results found.  Procedures Procedures (including critical care time)  Medications Ordered in UC Medications - No data to display  Initial Impression / Assessment and Plan / UC Course  I have reviewed the triage vital signs and the nursing notes.  Pertinent labs & imaging results that were available during my care of the patient were reviewed by me and considered in my medical decision making (see chart for details).    57 year old female presents with low back pain.  Concern for UTI.  UA not consistent with UTI.  Urine was very clear.  Meloxicam as directed.  Supportive care.  Final Clinical Impressions(s) / UC Diagnoses   Final diagnoses:  Acute low back pain without sciatica, unspecified back pain laterality     Discharge Instructions      Urine with no evidence of infection.  Medication as directed.   Follow up with your PCP.   ED Prescriptions     Medication Sig Dispense Auth. Provider   meloxicam (MOBIC) 15 MG tablet Take 1 tablet (15 mg total) by mouth daily as needed for pain. 30 tablet Tommie Sams, DO      PDMP not reviewed this encounter.   Tommie Sams, [place] 07/14/23 1430

## 2023-09-03 ENCOUNTER — Ambulatory Visit
Admission: EM | Admit: 2023-09-03 | Discharge: 2023-09-03 | Disposition: A | Discharge status: Home / Self Care | Payer: 59 | Attending: Emergency Medicine | Admitting: Emergency Medicine

## 2023-09-03 DIAGNOSIS — H6501 Acute serous otitis media, right ear: Secondary | ICD-10-CM

## 2023-09-03 DIAGNOSIS — H6991 Unspecified Eustachian tube disorder, right ear: Secondary | ICD-10-CM | POA: Diagnosis not present

## 2023-09-03 MED ORDER — FLUTICASONE PROPIONATE 50 MCG/ACT NA SUSP
2.0000 | Freq: Every day | NASAL | 0 refills | Status: AC
Start: 2023-09-03 — End: ?

## 2023-09-03 MED ORDER — PREDNISONE 20 MG PO TABS: 40.0000 mg | ORAL_TABLET | Freq: Every day | ORAL | 0 refills | Status: AC

## 2023-09-03 MED ORDER — AMOXICILLIN-POT CLAVULANATE 875-125 MG PO TABS: 1.0000 | ORAL_TABLET | Freq: Two times a day (BID) | ORAL | 0 refills | Status: AC

## 2023-09-03 NOTE — Discharge Instructions (Signed)
 Try Claritin, Allegra or Zyrtec, Flonase  and the prednisone .  If is not helping, or you start to get worse, go ahead and start the Augmentin .  Follow-up with [facility] if your symptoms do not improve after finishing the steroids and antibiotics.

## 2023-09-03 NOTE — ED Triage Notes (Signed)
 Pt c/o R ear fullness & pain x1 wk.

## 2023-09-03 NOTE — ED Provider Notes (Signed)
 HPI  SUBJECTIVE:  Angela Stevens is a 58 y.o. female who presents with right ear fullness, pain, decreased hearing, sensation of fluid in her ear.  She reports popping, tinnitus.  She wears ear buds on a regular basis.  No fevers, nasal congestion, rhinorrhea, otorrhea, change in her baseline vertigo.  No jaw or dental pain,.  She has been taking Tylenol and blowing her nose.  The Tylenol and when she popped open her ears helps.  No aggravating factors.  She is a borderline diabetic.  PCP: [facility].    Past Medical History:  Diagnosis Date   Anemia    Anxiety    Chronic bronchitis (HCC)    COVID-19 01/26/2020   Depression    GERD (gastroesophageal reflux disease)    History of Helicobacter pylori infection     Past Surgical History:  Procedure Laterality Date   NO PAST SURGERIES      Family History  Problem Relation Age of Onset   Alzheimer's disease Mother    Diabetes Mother    Kidney failure Father     Social History   Tobacco Use   Smoking status: Never   Smokeless tobacco: Never  Vaping Use   Vaping status: Never Used  Substance Use Topics   Alcohol use: Not Currently   Drug use: Never    No current facility-administered medications for this encounter.  Current Outpatient Medications:    amoxicillin-clavulanate (AUGMENTIN) 875-125 MG tablet, Take 1 tablet by mouth every 12 (twelve) hours for 7 days., Disp: 14 tablet, Rfl: 0   fluticasone (FLONASE) 50 MCG/ACT nasal spray, Place 2 sprays into both nostrils daily., Disp: 16 g, Rfl: 0   gabapentin (NEURONTIN) 100 MG capsule, Take 1 capsule by mouth 3 (three) times daily., Disp: , Rfl:    linaclotide (LINZESS) 145 MCG CAPS capsule, Take 1 capsule (145 mcg total) by mouth once daily, Disp: , Rfl:    predniSONE (DELTASONE) 20 MG tablet, Take 2 tablets (40 mg total) by mouth daily with breakfast for 5 days., Disp: 10 tablet, Rfl: 0   acetaminophen (TYLENOL) 500 MG tablet, Take by mouth., Disp: , Rfl:     albuterol (PROVENTIL) (2.5 MG/3ML) 0.083% nebulizer solution, Inhale into the lungs., Disp: , Rfl:    albuterol (VENTOLIN HFA) 108 (90 Base) MCG/ACT inhaler, 2 puff(s) by inhalation every 4-6 hours prn, Disp: , Rfl:    budesonide (PULMICORT) 1 MG/2ML nebulizer solution, Inhale into the lungs., Disp: , Rfl:    buPROPion (WELLBUTRIN XL) 300 MG 24 hr tablet, Take 1 tablet by mouth daily., Disp: , Rfl:    clonazePAM (KLONOPIN) 0.5 MG tablet, Take 0.5 mg by mouth daily as needed., Disp: , Rfl:    estradiol (ESTRACE) 0.1 MG/GM vaginal cream, Place 0.5 g vaginally 2 (two) times a week., Disp: , Rfl:    famotidine (PEPCID) 20 MG tablet, Take 1 tablet (20 mg total) by mouth 2 (two) times daily., Disp: 30 tablet, Rfl: 0   fexofenadine (ALLEGRA) 180 MG tablet, Take 1 tablet by mouth daily as needed., Disp: , Rfl:    fluticasone (FLONASE) 50 MCG/ACT nasal spray, Place 2 sprays into the nose daily as needed., Disp: , Rfl:    fluticasone (FLOVENT HFA) 110 MCG/ACT inhaler, Inhale 1 puff into the lungs 2 (two) times daily., Disp: , Rfl:    pantoprazole (PROTONIX) 40 MG tablet, Take 1 tablet (40 mg total) by mouth daily., Disp: 30 tablet, Rfl: 0   polyethylene glycol powder (GLYCOLAX/MIRALAX) 17 GM/SCOOP  powder, as needed., Disp: , Rfl:    promethazine (PHENERGAN) 25 MG tablet, Take by mouth., Disp: , Rfl:    sertraline (ZOLOFT) 50 MG tablet, Take 1 tablet by mouth daily., Disp: , Rfl:    sucralfate (CARAFATE) 1 g tablet, Take 1 tablet by mouth at bedtime., Disp: , Rfl:    triamcinolone ointment (KENALOG) 0.1 %, Apply topically 2 (two) times daily., Disp: , Rfl:   No Known Allergies   ROS  As noted in HPI.   Physical Exam  BP 122/74 (BP Location: Left Arm)   Pulse 62   Temp 98.4 F (36.9 C) (Oral)   Resp 16   Ht 5\' 3"  (1.6 m)   Wt 91.2 kg   SpO2 100%   BMI 35.61 kg/m   Constitutional: Well developed, well nourished, no acute distress Eyes:  EOMI, conjunctiva normal bilaterally HENT:  Normocephalic, atraumatic,mucus membranes moist. Right ear: Right external ear normal.  Abrasion in the 7 o'clock position EAC.  EAC nontender, nonerythematous not swollen.  TM intact.  Serous effusion behind TM.  No tenderness over the TMJ.  Hearing decreased compared to the left.   Left TM normal. Positive nasal congestion. Neck: No cervical lymphadenopathy Respiratory: Normal inspiratory effort Cardiovascular: Normal rate GI: nondistended skin: No rash, skin intact Musculoskeletal: no deformities Neurologic: Alert & oriented x 3, no focal neuro deficits Psychiatric: Speech and behavior appropriate   ED Course   Medications - No data to display  No orders of the defined types were placed in this encounter.   No results found for this or any previous visit (from the past 24 hours). No results found.  ED Clinical Impression  1. Non-recurrent acute serous otitis media of right ear   2. Dysfunction of right eustachian tube      ED Assessment/Plan     Patient with a serous otitis media and possible eustachian tube dysfunction.  Will send home with prednisone 40 mg for 5 days, Flonase, advise an antihistamine such as Claritin, Zyrtec or Allegra and a wait-and-see prescription of Augmentin.  Follow-up with ENT if this does not resolve her symptoms.  Discussed MDM, treatment plan, and plan for follow-up with patient. patient agrees with plan.   Meds ordered this encounter  Medications   predniSONE (DELTASONE) 20 MG tablet    Sig: Take 2 tablets (40 mg total) by mouth daily with breakfast for 5 days.    Dispense:  10 tablet    Refill:  0   fluticasone (FLONASE) 50 MCG/ACT nasal spray    Sig: Place 2 sprays into both nostrils daily.    Dispense:  16 g    Refill:  0   amoxicillin-clavulanate (AUGMENTIN) 875-125 MG tablet    Sig: Take 1 tablet by mouth every 12 (twelve) hours for 7 days.    Dispense:  14 tablet    Refill:  0      *This clinic note was created using  Scientist, clinical (histocompatibility and immunogenetics). Therefore, there may be occasional mistakes despite careful proofreading.  ?    Domenick Gong, MD 09/07/23 1456

## 2023-12-03 ENCOUNTER — Ambulatory Visit
Admission: EM | Admit: 2023-12-03 | Discharge: 2023-12-03 | Disposition: A | Discharge status: Home / Self Care | Attending: Emergency Medicine | Admitting: Emergency Medicine

## 2023-12-03 ENCOUNTER — Encounter: Payer: Self-pay | Admitting: Emergency Medicine

## 2023-12-03 DIAGNOSIS — B3731 Acute candidiasis of vulva and vagina: Secondary | ICD-10-CM | POA: Diagnosis present

## 2023-12-03 DIAGNOSIS — N390 Urinary tract infection, site not specified: Secondary | ICD-10-CM | POA: Diagnosis present

## 2023-12-03 LAB — URINALYSIS, W/ REFLEX TO CULTURE (INFECTION SUSPECTED)
Bilirubin Urine: NEGATIVE
Glucose, UA: 100 mg/dL — AB
Ketones, ur: NEGATIVE mg/dL
Nitrite: POSITIVE — AB
Specific Gravity, Urine: 1.02 (ref 1.005–1.030)
pH: 5.5 (ref 5.0–8.0)

## 2023-12-03 MED ORDER — NITROFURANTOIN MONOHYD MACRO 100 MG PO CAPS: 100.0000 mg | ORAL_CAPSULE | Freq: Two times a day (BID) | ORAL | 0 refills | Status: AC

## 2023-12-03 MED ORDER — FLUCONAZOLE 150 MG PO TABS: 150.0000 mg | ORAL_TABLET | ORAL | 0 refills | Status: AC

## 2023-12-03 MED ORDER — PHENAZOPYRIDINE HCL 200 MG PO TABS: 200.0000 mg | ORAL_TABLET | Freq: Three times a day (TID) | ORAL | 0 refills | Status: AC

## 2023-12-03 NOTE — ED Triage Notes (Addendum)
 Patient presents to [facility] for vaginal bleeding and urinary freq x 2 days. Positive rapid UTI test, bleeding stopped Saturday. Took ibuprofen.

## 2023-12-03 NOTE — ED Provider Notes (Signed)
 [facility]    CSN: [id number] Arrival date & time: 12/03/23  1019      History   Chief Complaint Chief Complaint  Patient presents with   Urinary Frequency   Vaginal Bleeding    HPI Angela Stevens is a 58 y.o. female.   HPI  58 year old female with past medical history [place] for H. pylori infection, GERD, depression, chronic bronchitis, anxiety, anemia, and who is 10 years post menopausal presents for evaluation of vaginal bleeding that occurred 2 days ago and resolved, along with dysuria, urgency, frequency, and suprapubic pain.  She denies fever, low back pain, nausea, vomiting, vaginal discharge, or vaginal itching.  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      Home Medications    Prior to Admission medications   Medication Sig Start Date End Date Taking?  Authorizing Provider  fluconazole (DIFLUCAN) 150 MG tablet Take 1 tablet (150 mg total) by mouth every 3 (three) days for 3 doses. 12/03/23 12/10/23 Yes Kent Pear, NP  nitrofurantoin , macrocrystal-monohydrate, (MACROBID ) 100 MG capsule Take 1 capsule (100 mg total) by mouth 2 (two) times daily. 12/03/23  Yes Kent Pear, NP  phenazopyridine  (PYRIDIUM ) 200 MG tablet Take 1 tablet (200 mg total) by mouth 3 (three) times daily. 12/03/23  Yes Kent Pear, NP  acetaminophen (TYLENOL) 500 MG tablet Take by mouth. 02/08/20   [provider]  albuterol (PROVENTIL) (2.5 MG/3ML) 0.083% nebulizer solution Inhale into the lungs. 08/11/21 09/03/23  [provider]  albuterol (VENTOLIN HFA) 108 (90 Base) MCG/ACT inhaler 2 puff(s) by inhalation every 4-6 hours prn 10/24/18   [provider]  budesonide (PULMICORT) 1 MG/2ML nebulizer solution Inhale into the lungs. 02/05/20   [provider]  buPROPion (WELLBUTRIN XL) 300 MG 24 hr tablet Take 1 tablet by mouth daily. 12/09/18   [provider]  clonazePAM (KLONOPIN) 0.5 MG tablet Take 0.5 mg by mouth daily as needed. 01/05/20   [provider]  estradiol (ESTRACE) 0.1 MG/GM vaginal cream Place 0.5 g vaginally 2 (two) times a week. 10/26/21   [provider]  famotidine  (PEPCID ) 20 MG tablet Take 1 tablet (20 mg total) by mouth 2 (two) times daily. 04/26/23   Mortenson, Ashley, MD  fexofenadine (ALLEGRA) 180 MG tablet Take 1 tablet by mouth daily as needed.  [provider]  fluticasone  (FLONASE ) 50 MCG/ACT nasal spray Place 2 sprays into the nose daily as needed. 07/29/18   [provider]  fluticasone  (FLONASE ) 50 MCG/ACT nasal spray Place 2 sprays into both nostrils daily. 09/03/23   Ethlyn Herd, MD  fluticasone  (FLOVENT  HFA) 110 MCG/ACT inhaler Inhale 1 puff into the lungs 2 (two) times daily. 03/24/15   [provider]  gabapentin (NEURONTIN) 100 MG capsule Take 1 capsule by  mouth 3 (three) times daily. 08/08/23 08/07/24  [provider]  linaclotide Glory Larsen) 145 MCG CAPS capsule Take 1 capsule (145 mcg total) by mouth once daily 08/08/23   [provider]  pantoprazole  (PROTONIX ) 40 MG tablet Take 1 tablet (40 mg total) by mouth daily. 04/26/23   Ethlyn Herd, MD  polyethylene glycol powder (GLYCOLAX/MIRALAX) 17 GM/SCOOP powder as needed.    [provider]  promethazine (PHENERGAN) 25 MG tablet Take by mouth.    [provider]  sertraline (ZOLOFT) 50 MG tablet Take 1 tablet by mouth daily. 10/24/21 10/24/22  [provider]  sucralfate (CARAFATE) 1 g tablet Take 1 tablet by mouth at bedtime. 10/13/19   [provider]  triamcinolone ointment (KENALOG) 0.1 % Apply topically 2 (two) times daily. 12/29/19   [provider]    Family History Family History  Problem Relation Age of Onset   Alzheimer's disease Mother    Diabetes Mother    Kidney failure Father     Social History Social History   Tobacco Use   Smoking status: Never   Smokeless tobacco: Never  Vaping Use   Vaping status: Never Used  Substance Use Topics   Alcohol use: Not Currently   Drug use: Never     Allergies   Patient has no known allergies.   Review of Systems Review of Systems  Constitutional:  Negative for fever.  Gastrointestinal:  Positive for abdominal pain. Negative for nausea and vomiting.  Genitourinary:  Positive for dysuria, frequency, urgency and vaginal bleeding. Negative for hematuria, vaginal discharge and vaginal pain.  Musculoskeletal:  Negative for back pain.     Physical Exam Triage Vital Signs ED Triage Vitals  Encounter Vitals Group     BP      Systolic BP Percentile      Diastolic BP Percentile      Pulse      Resp      Temp      Temp src      SpO2      Weight      Height      Head Circumference      Peak Flow      Pain Score      Pain Loc      Pain Education      Exclude from  Growth Chart    No data found.  Updated Vital Signs BP 108/65 (BP Location: Right Arm)   Pulse (!) 55   Temp 98.3 F (36.8 C) (Oral)   Resp 16   SpO2 99%   Visual Acuity Right Eye Distance:   Left Eye Distance:   Bilateral Distance:    Right Eye Near:   Left Eye Near:    Bilateral Near:     Physical Exam Vitals and nursing note reviewed.  Constitutional:      Appearance: Normal appearance. She is not ill-appearing.  HENT:     Head: Normocephalic and atraumatic.  Cardiovascular:     Rate and Rhythm: Normal rate and  regular rhythm.     Pulses: Normal pulses.     Heart sounds: Normal heart sounds. No murmur heard.    No friction rub. No gallop.  Pulmonary:     Effort: Pulmonary effort is normal.     Breath sounds: Normal breath sounds. No wheezing, rhonchi or rales.  Abdominal:     General: Abdomen is flat.     Palpations: Abdomen is soft.     Tenderness: There is no abdominal tenderness. There is no right CVA tenderness, left CVA tenderness, guarding or rebound.  Skin:    General: Skin is warm and dry.     Capillary Refill: Capillary refill takes less than 2 seconds.     Findings: No rash.  Neurological:     General: No focal deficit present.     Mental Status: She is alert and oriented to person, place, and time.      UC Treatments / Results  Labs (all labs ordered are listed, but only abnormal results are displayed) Labs Reviewed  URINALYSIS, W/ REFLEX TO CULTURE (INFECTION SUSPECTED) - Abnormal; Notable for the following components:      Result Value   APPearance HAZY (*)    Glucose, UA 100 (*)    Hgb urine dipstick LARGE (*)    Protein, ur TRACE (*)    Nitrite POSITIVE (*)    Leukocytes,Ua SMALL (*)    Bacteria, UA MANY (*)    All other components within normal limits  URINE CULTURE  CERVICOVAGINAL ANCILLARY ONLY    EKG   Radiology No results found.  Procedures Procedures (including critical care time)  Medications Ordered in  UC Medications - No data to display  Initial Impression / Assessment and Plan / UC Course  I have reviewed the triage vital signs and the nursing notes.  Pertinent labs & imaging results that were available during my care of the patient were reviewed by me and considered in my medical decision making (see chart for details).   Patient is a pleasant, nontoxic-appearing 58 year old female presenting for evaluation of genitourinary complaints as outlined in HPI above.  She reports that she had vaginal bleeding 2 days ago but that that has resolved but she continues to have dysuria, urgency, and frequency.  The patient is 10 years postmenopausal and reports that she does not currently have a GYN.  Given that she is experiencing urinary symptoms the question is what she experiencing vaginal bleeding or why she is bleeding bleeding from her bladder.  I will order a urinalysis to assess for the presence of UTI.  I will also order a vaginal cytology swab to assess for the presence of BV or yeast which could also cause vaginal bleeding.  If those 2 tests are negative I have advised the patient that she needs to follow-up with GYN for further testing to determine the cause of vaginal bleeding given that it should not occur in a postmenopausal woman and further workup would be necessary.  Urinalysis has a hazy appearance with small excite esterase, nitrite positive, trace protein, large hemoglobin, and 100 glucose.  Reflex microscopy shows 11-20 WBCs, 21-50 RBCs, many bacteria, budding yeast, and calcium oxalate crystals.  Urine will reflex to culture.  I will discharge patient with diagnosis of urinary tract infection and vaginal yeast infection.  I will treat her UTI with Macrobid  100 g twice daily for 5 days with food along with Pyridium  200 mg every 8 hours help with urinary discomfort.  For the yeast infection  I will use Diflucan 150 mg tablets, 1 tablet now and repeat dosing every 3 days for total 3  doses.   Final Clinical Impressions(s) / UC Diagnoses   Final diagnoses:  Lower urinary tract infectious disease  Vaginal yeast infection     Discharge Instructions      Take the Macrobid  twice daily for 5 days with food for treatment of urinary tract infection.  Use the Pyridium  every 8 hours as needed for urinary discomfort.  This will turn your urine a bright red-orange.  Increase your oral fluid intake so that you increase your urine production and or flushing your urinary system.  Take an over-the-counter probiotic, such as Culturelle-Align-Activia, 1 hour after each dose of antibiotic to prevent diarrhea or yeast infections from forming.  We will culture urine and change the antibiotics if necessary.  For your vaginal yeast infection take the Diflucan 150 mg tablets.  Take 1 tablet now and repeat dosing every 3 days for total of 3 doses.  Return for reevaluation, or see your primary care provider, for any new or worsening symptoms.    ED Prescriptions     Medication Sig Dispense Auth. Provider   nitrofurantoin , macrocrystal-monohydrate, (MACROBID ) 100 MG capsule Take 1 capsule (100 mg total) by mouth 2 (two) times daily. 10 capsule Kent Pear, NP   phenazopyridine  (PYRIDIUM ) 200 MG tablet Take 1 tablet (200 mg total) by mouth 3 (three) times daily. 6 tablet Kent Pear, NP   fluconazole (DIFLUCAN) 150 MG tablet Take 1 tablet (150 mg total) by mouth every 3 (three) days for 3 doses. 3 tablet Kent Pear, NP      PDMP not reviewed this encounter.   Kent Pear, NP 12/03/23 1123

## 2023-12-03 NOTE — Discharge Instructions (Addendum)
 Take the Macrobid  twice daily for 5 days with food for treatment of urinary tract infection.  Use the Pyridium  every 8 hours as needed for urinary discomfort.  This will turn your urine a bright red-orange.  Increase your oral fluid intake so that you increase your urine production and or flushing your urinary system.  Take an over-the-counter probiotic, such as Culturelle-Align-Activia, 1 hour after each dose of antibiotic to prevent diarrhea or yeast infections from forming.  We will culture urine and change the antibiotics if necessary.  For your vaginal yeast infection take the Diflucan 150 mg tablets.  Take 1 tablet now and repeat dosing every 3 days for total of 3 doses.  Return for reevaluation, or see your primary care provider, for any new or worsening symptoms.

## 2023-12-04 ENCOUNTER — Ambulatory Visit: Payer: Self-pay

## 2023-12-04 LAB — CERVICOVAGINAL ANCILLARY ONLY
Bacterial Vaginitis (gardnerella): POSITIVE — AB
Candida Glabrata: NEGATIVE
Candida Vaginitis: NEGATIVE
Chlamydia: NEGATIVE
Comment: NEGATIVE
Comment: NEGATIVE
Comment: NEGATIVE
Comment: NEGATIVE
Comment: NEGATIVE
Comment: NORMAL
Neisseria Gonorrhea: NEGATIVE
Trichomonas: NEGATIVE

## 2023-12-04 MED ORDER — METRONIDAZOLE 500 MG PO TABS: 500.0000 mg | ORAL_TABLET | Freq: Two times a day (BID) | ORAL | 0 refills | Status: AC

## 2023-12-05 LAB — URINE CULTURE: Culture: >=100000 — AB
